# Patient Record
Sex: Female | Born: 1955 | Race: White | Hispanic: No | Smoking: Current every day smoker
Health system: Southern US, Community
[De-identification: ages and names within clinical notes are randomized; demographics above are authoritative.]

## PROBLEM LIST (undated history)

## (undated) DIAGNOSIS — E271 Primary adrenocortical insufficiency: Secondary | ICD-10-CM

## (undated) DIAGNOSIS — E079 Disorder of thyroid, unspecified: Secondary | ICD-10-CM

## (undated) DIAGNOSIS — E119 Type 2 diabetes mellitus without complications: Secondary | ICD-10-CM

## (undated) DIAGNOSIS — K5190 Ulcerative colitis, unspecified, without complications: Secondary | ICD-10-CM

## (undated) DIAGNOSIS — I10 Essential (primary) hypertension: Secondary | ICD-10-CM

## (undated) DIAGNOSIS — M549 Dorsalgia, unspecified: Secondary | ICD-10-CM

## (undated) DIAGNOSIS — E785 Hyperlipidemia, unspecified: Secondary | ICD-10-CM

## (undated) HISTORY — DX: Essential (primary) hypertension: I10

## (undated) HISTORY — DX: Primary adrenocortical insufficiency: E27.1

## (undated) HISTORY — DX: Ulcerative colitis, unspecified, without complications: K51.90

## (undated) HISTORY — DX: Hyperlipidemia, unspecified: E78.5

---

## 1979-06-19 HISTORY — PX: TOTAL ABDOMINAL HYSTERECTOMY: SHX209

## 2012-06-18 HISTORY — PX: STENT PLACEMENT ILIAC (ARMC HX): HXRAD1735

## 2013-06-18 HISTORY — PX: HEMORROIDECTOMY: SUR656

## 2014-06-18 DIAGNOSIS — E271 Primary adrenocortical insufficiency: Secondary | ICD-10-CM

## 2014-06-18 HISTORY — DX: Primary adrenocortical insufficiency: E27.1

## 2014-11-26 HISTORY — PX: BACK SURGERY: SHX140

## 2015-02-07 ENCOUNTER — Observation Stay (HOSPITAL_COMMUNITY)
Admission: EM | Admit: 2015-02-07 | Discharge: 2015-02-09 | Disposition: A | Discharge status: Home / Self Care | Payer: BLUE CROSS/BLUE SHIELD | Attending: Student in an Organized Health Care Education/Training Program | Admitting: Student in an Organized Health Care Education/Training Program

## 2015-02-07 ENCOUNTER — Observation Stay (HOSPITAL_COMMUNITY): Payer: BLUE CROSS/BLUE SHIELD

## 2015-02-07 ENCOUNTER — Encounter (HOSPITAL_COMMUNITY): Payer: Self-pay | Admitting: *Deleted

## 2015-02-07 DIAGNOSIS — E860 Dehydration: Secondary | ICD-10-CM | POA: Insufficient documentation

## 2015-02-07 DIAGNOSIS — E119 Type 2 diabetes mellitus without complications: Secondary | ICD-10-CM | POA: Diagnosis not present

## 2015-02-07 DIAGNOSIS — R55 Syncope and collapse: Secondary | ICD-10-CM | POA: Diagnosis not present

## 2015-02-07 DIAGNOSIS — R112 Nausea with vomiting, unspecified: Secondary | ICD-10-CM | POA: Insufficient documentation

## 2015-02-07 DIAGNOSIS — N179 Acute kidney failure, unspecified: Principal | ICD-10-CM | POA: Diagnosis present

## 2015-02-07 DIAGNOSIS — E871 Hypo-osmolality and hyponatremia: Secondary | ICD-10-CM | POA: Insufficient documentation

## 2015-02-07 DIAGNOSIS — I959 Hypotension, unspecified: Secondary | ICD-10-CM | POA: Diagnosis not present

## 2015-02-07 DIAGNOSIS — Z794 Long term (current) use of insulin: Secondary | ICD-10-CM | POA: Insufficient documentation

## 2015-02-07 DIAGNOSIS — F17210 Nicotine dependence, cigarettes, uncomplicated: Secondary | ICD-10-CM | POA: Diagnosis not present

## 2015-02-07 DIAGNOSIS — E861 Hypovolemia: Secondary | ICD-10-CM | POA: Insufficient documentation

## 2015-02-07 DIAGNOSIS — E2740 Unspecified adrenocortical insufficiency: Secondary | ICD-10-CM | POA: Diagnosis not present

## 2015-02-07 DIAGNOSIS — I10 Essential (primary) hypertension: Secondary | ICD-10-CM | POA: Diagnosis not present

## 2015-02-07 DIAGNOSIS — E1165 Type 2 diabetes mellitus with hyperglycemia: Secondary | ICD-10-CM

## 2015-02-07 DIAGNOSIS — E039 Hypothyroidism, unspecified: Secondary | ICD-10-CM | POA: Diagnosis present

## 2015-02-07 HISTORY — DX: Type 2 diabetes mellitus without complications: E11.9

## 2015-02-07 HISTORY — DX: Dorsalgia, unspecified: M54.9

## 2015-02-07 HISTORY — DX: Disorder of thyroid, unspecified: E07.9

## 2015-02-07 LAB — CBG MONITORING, ED: Glucose-Capillary: 155 mg/dL — ABNORMAL HIGH (ref 65–99)

## 2015-02-07 LAB — COMPREHENSIVE METABOLIC PANEL WITH GFR
ALT: 12 U/L — ABNORMAL LOW (ref 14–54)
AST: 17 U/L (ref 15–41)
Albumin: 4.2 g/dL (ref 3.5–5.0)
Alkaline Phosphatase: 82 U/L (ref 38–126)
Anion gap: 15 (ref 5–15)
BUN: 32 mg/dL — ABNORMAL HIGH (ref 6–20)
CO2: 22 mmol/L (ref 22–32)
Calcium: 10.2 mg/dL (ref 8.9–10.3)
Chloride: 97 mmol/L — ABNORMAL LOW (ref 101–111)
Creatinine, Ser: 3.64 mg/dL — ABNORMAL HIGH (ref 0.44–1.00)
GFR calc Af Amer: 15 mL/min — ABNORMAL LOW
GFR calc non Af Amer: 13 mL/min — ABNORMAL LOW
Glucose, Bld: 158 mg/dL — ABNORMAL HIGH (ref 65–99)
Potassium: 4.2 mmol/L (ref 3.5–5.1)
Sodium: 134 mmol/L — ABNORMAL LOW (ref 135–145)
Total Bilirubin: 0.8 mg/dL (ref 0.3–1.2)
Total Protein: 7.2 g/dL (ref 6.5–8.1)

## 2015-02-07 LAB — LIPASE, BLOOD: Lipase: 39 U/L (ref 22–51)

## 2015-02-07 LAB — CBC WITH DIFFERENTIAL/PLATELET
Basophils Absolute: 0.1 K/uL (ref 0.0–0.1)
Basophils Relative: 0 % (ref 0–1)
Eosinophils Absolute: 0.1 K/uL (ref 0.0–0.7)
Eosinophils Relative: 1 % (ref 0–5)
HCT: 39.8 % (ref 36.0–46.0)
Hemoglobin: 14.3 g/dL (ref 12.0–15.0)
Lymphocytes Relative: 23 % (ref 12–46)
Lymphs Abs: 2.9 K/uL (ref 0.7–4.0)
MCH: 35.4 pg — ABNORMAL HIGH (ref 26.0–34.0)
MCHC: 35.9 g/dL (ref 30.0–36.0)
MCV: 98.5 fL (ref 78.0–100.0)
Monocytes Absolute: 0.6 K/uL (ref 0.1–1.0)
Monocytes Relative: 5 % (ref 3–12)
Neutro Abs: 9.1 K/uL — ABNORMAL HIGH (ref 1.7–7.7)
Neutrophils Relative %: 71 % (ref 43–77)
Platelets: 244 K/uL (ref 150–400)
RBC: 4.04 MIL/uL (ref 3.87–5.11)
RDW: 13.9 % (ref 11.5–15.5)
WBC: 12.8 K/uL — ABNORMAL HIGH (ref 4.0–10.5)

## 2015-02-07 LAB — URINE MICROSCOPIC-ADD ON

## 2015-02-07 LAB — URINALYSIS, ROUTINE W REFLEX MICROSCOPIC
Bilirubin Urine: NEGATIVE
Glucose, UA: NEGATIVE mg/dL
Hgb urine dipstick: NEGATIVE
Ketones, ur: NEGATIVE mg/dL
Leukocytes, UA: NEGATIVE
Nitrite: NEGATIVE
Protein, ur: 30 mg/dL — AB
Specific Gravity, Urine: 1.007 (ref 1.005–1.030)
Urobilinogen, UA: 0.2 mg/dL (ref 0.0–1.0)
pH: 6.5 (ref 5.0–8.0)

## 2015-02-07 LAB — GLUCOSE, CAPILLARY: GLUCOSE-CAPILLARY: 224 mg/dL — AB (ref 65–99)

## 2015-02-07 LAB — MRSA PCR SCREENING: MRSA BY PCR: POSITIVE — AB

## 2015-02-07 LAB — I-STAT CG4 LACTIC ACID, ED: LACTIC ACID, VENOUS: 2.66 mmol/L — AB (ref 0.5–2.0)

## 2015-02-07 LAB — CREATININE, URINE, RANDOM: CREATININE, URINE: 58.61 mg/dL

## 2015-02-07 LAB — SODIUM, URINE, RANDOM: Sodium, Ur: 64 mmol/L

## 2015-02-07 MED ORDER — MORPHINE SULFATE (PF) 2 MG/ML IV SOLN: 2.0000 mg | Freq: Once | INTRAVENOUS | Status: DC

## 2015-02-07 MED ORDER — SODIUM CHLORIDE 0.9 % IV SOLN
Freq: Once | INTRAVENOUS | Status: AC
  Administered 2015-02-07: 13:00:00 via INTRAVENOUS

## 2015-02-07 MED ORDER — SODIUM CHLORIDE 0.9 % IV BOLUS (SEPSIS)
1000.0000 mL | Freq: Once | INTRAVENOUS | Status: AC
  Administered 2015-02-07: 1000 mL via INTRAVENOUS

## 2015-02-07 MED ORDER — SODIUM CHLORIDE 0.9 % IV SOLN: 1000.0000 mL | Freq: Once | INTRAVENOUS | Status: DC

## 2015-02-07 MED ORDER — INSULIN ASPART 100 UNIT/ML ~~LOC~~ SOLN
0.0000 [IU] | Freq: Every day | SUBCUTANEOUS | Status: DC
  Administered 2015-02-07: 2 [IU] via SUBCUTANEOUS

## 2015-02-07 MED ORDER — SODIUM CHLORIDE 0.9 % IV SOLN: 250.0000 mL | INTRAVENOUS | Status: DC | PRN

## 2015-02-07 MED ORDER — INSULIN ASPART 100 UNIT/ML ~~LOC~~ SOLN
0.0000 [IU] | Freq: Three times a day (TID) | SUBCUTANEOUS | Status: DC
  Administered 2015-02-08: 5 [IU] via SUBCUTANEOUS
  Administered 2015-02-08 – 2015-02-09 (×2): 1 [IU] via SUBCUTANEOUS

## 2015-02-07 MED ORDER — SODIUM CHLORIDE 0.9 % IJ SOLN: 3.0000 mL | INTRAMUSCULAR | Status: DC | PRN

## 2015-02-07 MED ORDER — ACETAMINOPHEN 325 MG PO TABS: 650.0000 mg | ORAL_TABLET | Freq: Four times a day (QID) | ORAL | Status: DC | PRN

## 2015-02-07 MED ORDER — ONDANSETRON HCL 4 MG/2ML IJ SOLN: 4.0000 mg | Freq: Three times a day (TID) | INTRAMUSCULAR | Status: DC | PRN

## 2015-02-07 MED ORDER — SODIUM CHLORIDE 0.9 % IV BOLUS (SEPSIS)
2000.0000 mL | Freq: Once | INTRAVENOUS | Status: AC
  Administered 2015-02-07: 2000 mL via INTRAVENOUS

## 2015-02-07 MED ORDER — ONDANSETRON HCL 4 MG/2ML IJ SOLN: 4.0000 mg | Freq: Four times a day (QID) | INTRAMUSCULAR | Status: DC | PRN

## 2015-02-07 MED ORDER — ENOXAPARIN SODIUM 40 MG/0.4ML ~~LOC~~ SOLN
40.0000 mg | SUBCUTANEOUS | Status: DC
  Administered 2015-02-07 – 2015-02-08 (×2): 40 mg via SUBCUTANEOUS
  Filled 2015-02-07 (×2): qty 0.4

## 2015-02-07 MED ORDER — MORPHINE SULFATE (PF) 2 MG/ML IV SOLN
2.0000 mg | INTRAVENOUS | Status: DC | PRN
  Administered 2015-02-07 – 2015-02-08 (×2): 2 mg via INTRAVENOUS
  Filled 2015-02-07 (×2): qty 1

## 2015-02-07 MED ORDER — SODIUM CHLORIDE 0.9 % IV SOLN
INTRAVENOUS | Status: DC
  Administered 2015-02-07 (×3): via INTRAVENOUS

## 2015-02-07 MED ORDER — LEVOTHYROXINE SODIUM 25 MCG PO TABS
175.0000 ug | ORAL_TABLET | Freq: Every day | ORAL | Status: DC
  Administered 2015-02-08 – 2015-02-09 (×2): 175 ug via ORAL
  Filled 2015-02-07 (×4): qty 1

## 2015-02-07 MED ORDER — ONDANSETRON HCL 4 MG/2ML IJ SOLN
4.0000 mg | Freq: Once | INTRAMUSCULAR | Status: AC
  Administered 2015-02-07: 4 mg via INTRAVENOUS
  Filled 2015-02-07: qty 2

## 2015-02-07 MED ORDER — SODIUM CHLORIDE 0.9 % IJ SOLN: 3.0000 mL | Freq: Two times a day (BID) | INTRAMUSCULAR | Status: DC

## 2015-02-07 MED ORDER — MORPHINE SULFATE (PF) 2 MG/ML IV SOLN: 2.0000 mg | INTRAVENOUS | Status: DC | PRN

## 2015-02-07 MED ORDER — ONDANSETRON HCL 4 MG PO TABS: 4.0000 mg | ORAL_TABLET | Freq: Four times a day (QID) | ORAL | Status: DC | PRN

## 2015-02-07 MED ORDER — ACETAMINOPHEN 650 MG RE SUPP: 650.0000 mg | Freq: Four times a day (QID) | RECTAL | Status: DC | PRN

## 2015-02-07 NOTE — ED Notes (Signed)
Incision from back surgery on June 10th inspected.   Healed well, no signs of infection.

## 2015-02-07 NOTE — ED Notes (Signed)
Patient transported to Ultrasound 

## 2015-02-07 NOTE — ED Provider Notes (Signed)
S: Courtney Bonilla is a 59 y.o. female presents to the ED with persistent nausea and vomiting x 2 weeks after treatment of a right breast cellulitis with Bactrim.  Presents today with generalized weakness, persistent nausea and vomiting, tachypnea and hypotension.  She reports abdominal soreness from emesis  O:  General: Awake  HEENT: Atraumatic, dry mucous membranes  Resp: Normal effort, clear and equal breath sounds  Abd: Nondistended, mild soreness no rebound or peritoneal signs, nonfocal  MSK: spinal surgical incision well healing without erythema, induration or purulent drainage Neuro:No focal weakness  Lymph: No adenopathy   A/P:  Patient with acute renal failure likely from Bactrim usage. Creatinine 3.64 and BUN 32 with no history of kidney disease. Mild leukocytosis at 12.8 and lactic acid 2.66. Vital signs have stabilized after fluid bolus. Will admit to teaching service. Discussed with Dr. Mikey Bussing.  BP 98/47 mmHg  Pulse 76  Temp(Src) 97.4 F (36.3 C) (Rectal)  Resp 24  SpO2 100%  Pt was seen by Gaylyn Rong, PA-C and personally evaluated and supervised by Dierdre Forth, PA-C and Rolland Porter, MD    [facility], PA-C 02/07/15 1429  Rolland Porter, MD 02/07/15 [phone]

## 2015-02-07 NOTE — ED Provider Notes (Signed)
CSN: [id number]     Arrival date & time 02/07/15  1059 History   First MD Initiated Contact with Patient 02/07/15 1156     Chief Complaint  Patient presents with  . Abdominal Pain  . Loss of Consciousness  . Weakness  . Emesis     (Consider location/radiation/quality/duration/timing/severity/associated sxs/prior Treatment) HPI Comments: Courtney Bonilla is a 59 y/o F with a pmhx of hypothyroidism, DM, and sciatica s/p back surgery on 11/26/14 who presents today c/o nausea, vomiting and gerneralized weakness. Pt states that since her back surgery in June she has not been feeling very well, "just weaker than usual". Pt states that in the last 2 weeks she has been extremely nauseous, vomiting multiple times a day and very week. Pt was seen at ED in [place] for same symptoms, given fluids and was d/c home on Bactrim for cellulitis on Right breast. Pt states Bactrim worsened her nausea so she stopped taking it. Today pt states that in the last week the nausea has worsened and now she is getting very dizzy and lightheaded and "falling into things". Pt states that she does not think she has actually passed out but she gets weak and stumbles while walking. Pt feels more dizzy when standing than sitting. No falls, or injury. Pt unable to tolerate PO fluids and medications, decreased appetite. No noticeable weight loss. Denies fever, chills, change in bowel or bladder habits, cough, SOB, CP. Pt states that abdomen hurts from vomiting. Pt is hypotensive in triage with a BP of 69/45, HR 130, and RR 32. Denies alcohol intake. Smokes 1 ppd. No recreational drugs.   Patient is a 59 y.o. female presenting with abdominal pain, syncope, weakness, and vomiting. The history is provided by the patient and the spouse.  Abdominal Pain Associated symptoms: fatigue, nausea and vomiting   Associated symptoms: no chest pain, no chills, no cough, no diarrhea, no dysuria, no fever, no hematuria, no shortness of breath, no vaginal  bleeding and no vaginal discharge   Loss of Consciousness Associated symptoms: dizziness, nausea, vomiting and weakness   Associated symptoms: no chest pain, no diaphoresis, no fever, no palpitations and no shortness of breath   Weakness Associated symptoms include abdominal pain, fatigue, nausea, vomiting and weakness. Pertinent negatives include no chest pain, chills, congestion, coughing, diaphoresis or fever.  Emesis Associated symptoms: abdominal pain   Associated symptoms: no chills and no diarrhea     Past Medical History  Diagnosis Date  . Diabetes mellitus without complication   . Back pain    Past Surgical History  Procedure Laterality Date  . Back surgery     No family history on file. Social History  Substance Use Topics  . Smoking status: Current Every Day Smoker  . Smokeless tobacco: None  . Alcohol Use: No   OB History    No data available     Review of Systems  Constitutional: Positive for appetite change and fatigue. Negative for fever, chills, diaphoresis and unexpected weight change.  HENT: Negative for congestion.   Eyes: Negative for visual disturbance.  Respiratory: Negative for cough, chest tightness and shortness of breath.   Cardiovascular: Positive for syncope. Negative for chest pain, palpitations and leg swelling.  Gastrointestinal: Positive for nausea, vomiting and abdominal pain. Negative for diarrhea.  Genitourinary: Negative for dysuria, hematuria, vaginal bleeding, vaginal discharge and difficulty urinating.  Musculoskeletal: Positive for back pain.  Skin: Positive for wound ( cellulitis of right breast).  Neurological: Positive for dizziness, weakness and  light-headedness. Negative for syncope.  All other systems reviewed and are negative.     Allergies  Review of patient's allergies indicates no known allergies.  Home Medications   Prior to Admission medications   Medication Sig Start Date End Date Taking? Authorizing Provider   baclofen (LIORESAL) 10 MG tablet Take 10 mg by mouth 3 (three) times daily.   Yes Historical Provider, MD  levothyroxine (SYNTHROID, LEVOTHROID) 175 MCG tablet Take 175 mcg by mouth daily before breakfast.   Yes Historical Provider, MD  sulfamethoxazole-trimethoprim (BACTRIM DS,SEPTRA DS) 800-160 MG per tablet Take 1 tablet by mouth 2 (two) times daily.   Yes Historical Provider, MD   BP 69/45 mmHg  Pulse 130  Temp(Src) 97.6 F (36.4 C) (Oral)  Resp 32  SpO2 100% Physical Exam  Constitutional: She is oriented to person, place, and time. She appears well-developed and well-nourished. No distress.  Pt is very sleepy on exam.   HENT:  Head: Normocephalic and atraumatic.  Mouth/Throat: No oropharyngeal exudate.  Eyes: Conjunctivae are normal. Pupils are equal, round, and reactive to light. No scleral icterus.  Neck: Thyromegaly present.  Cardiovascular: Regular rhythm, normal heart sounds and intact distal pulses.  Exam reveals no gallop and no friction rub.   No murmur heard. Tachycardic HR 130bpm   Pulmonary/Chest: Effort normal and breath sounds normal. No respiratory distress. She has no wheezes. She has no rales. She exhibits no tenderness.  Abdominal: Soft. Bowel sounds are normal. She exhibits no distension and no mass. There is tenderness ( diffuse tenderness. No focal areas of tenderness). There is no rebound and no guarding.  Musculoskeletal: Normal range of motion. She exhibits no edema or tenderness.  Lymphadenopathy:    She has no cervical adenopathy.  Neurological: She is alert and oriented to person, place, and time. No cranial nerve deficit.  Skin: Skin is warm and dry. She is not diaphoretic.  2cm area of cellulitis to Right breast under areola. Area is erythematous. NOT warm to the touch. Appears clean and dry. No pus or blood. Area is scabbed over. In healing process.   Vitals reviewed.   ED Course  Procedures (including critical care time) Pt seen Peripheral IV  inserted Labs drawn EKG obtained Pt husband presented printed reports from visit to [facility] ED Labs ordered Rectal temp requested Spoke with Dr. Fayrene Fearing who agrees with plan of care Pt will be admitted to teaching service  Labs Review Labs Reviewed  CBC WITH DIFFERENTIAL/PLATELET - Abnormal; Notable for the following:    WBC 12.8 (*)    MCH 35.4 (*)    Neutro Abs 9.1 (*)    All other components within normal limits  COMPREHENSIVE METABOLIC PANEL - Abnormal; Notable for the following:    Sodium 134 (*)    Chloride 97 (*)    Glucose, Bld 158 (*)    BUN 32 (*)    Creatinine, Ser 3.64 (*)    ALT 12 (*)    GFR calc non Af Amer 13 (*)    GFR calc Af Amer 15 (*)    All other components within normal limits  URINALYSIS, ROUTINE W REFLEX MICROSCOPIC (NOT AT [facility]) - Abnormal; Notable for the following:    APPearance CLOUDY (*)    Protein, ur 30 (*)    All other components within normal limits  URINE MICROSCOPIC-ADD ON - Abnormal; Notable for the following:    Squamous Epithelial / LPF MANY (*)    Bacteria, UA FEW (*)  Casts GRANULAR CAST (*)    Crystals CA OXALATE CRYSTALS (*)    All other components within normal limits  CBG MONITORING, ED - Abnormal; Notable for the following:    Glucose-Capillary 155 (*)    All other components within normal limits  I-STAT CG4 LACTIC ACID, ED - Abnormal; Notable for the following:    Lactic Acid, Venous 2.66 (*)    All other components within normal limits  LIPASE, BLOOD  SODIUM, URINE, RANDOM  CREATININE, URINE, RANDOM  CYTOLOGY - NON PAP    Imaging Review No results found. I have personally reviewed and evaluated these images and lab results as part of my medical decision-making.   EKG Interpretation None      MDM   Final diagnoses:  AKI (acute kidney injury)   Pt seen for N/V, generalized weakness and near syncope.  Pt recently had back surgery 11/26/14. Pt placed on bactrim 2 weeks ago for R breast cellulitis. Pt states severe n/v  since initiating abx. Pt now in ARF, likely due to bactrim intake. Cr 3.6, BUN 32. Mild leukocytosis of 12.8. Pt vital signs have stabilized since administration of 2L fluid bolus. Pt now feeling much better, abdominal pain and nausea have subsided. Spoke with Dr. Mikey Bussing who agrees that the pt needs to be admitted. Teaching service will be accepting pt to their service for inpatient treatment and observation. UA negative for infection.    Lester Kinsman [place], PA-C 02/07/15 1502  Rolland Porter, MD 02/11/15 [phone]

## 2015-02-07 NOTE — ED Notes (Signed)
Blood cultures drawn and placed at bedside.

## 2015-02-07 NOTE — ED Notes (Signed)
Paged admitting provider to discuss pt's BP and level of care.

## 2015-02-07 NOTE — H&P (Signed)
Date: 02/07/2015               Patient Name:  Courtney Bonilla MRN: [id number]  DOB: 04-11-1956 Age / Sex: 59 y.o., female   PCP: No primary care provider on file.              Medical Service: Internal Medicine Teaching Service              Attending Physician: Dr. Tyson Alias, MD    First Contact: Eyvonne Mechanic, MS 3 Pager: [phone]  Second Contact: Dr. Darreld Mclean Pager: [phone]  Third Contact Dr. Carlynn Purl Pager: [phone]       After Hours (After 5p/  First Contact Pager: [phone]  weekends / holidays): Second Contact Pager: [phone]   Chief Complaint: Nausea and vomiting  History of Present Illness: Courtney Bonilla is a 59 y.o. female that  has a past medical history of Diabetes mellitus without complication; Back pain; and Thyroid disease. who presents with fours days of nausea and vomiting. She notes that she has been feeling fatigued and weak for the past few months, ever since she got surgery on her back for sciatica, but didn't seek medical attention until this past Wednesday (8/17) where she went to the [facility] ED where she was diagnosed with cellulitis of the R breast and sent home with trimethoprim-sulfamethoxazole. On Friday (8/19) she started feeling nauseated and vomiting which continued until presentation. She stopped taking the TMP-SMX on Saturday due to the nausea and vomiting. She has been unable to eat or drink anything since the nausea stated except for occasional soft drinks. She reports that she has felt very cold lately. She denies feeling feverish, headaches, difficulty breathing, or cough. She also denies pain with urination, urgency, increased frequency, and bowel or bladder incontinence. She does report general weakness and numbness in her feet.   Meds: Current Facility-Administered Medications  Medication Dose Route Frequency Provider Last Rate Last Dose  . morphine 2 MG/ML injection 2 mg  2 mg Intravenous Once Dierdre Forth, PA-C   Stopped at 02/07/15 1506   Current Outpatient Prescriptions  Medication Sig Dispense Refill  . baclofen (LIORESAL) 10 MG tablet Take 10 mg by mouth 3 (three) times daily.    Marland Kitchen levothyroxine (SYNTHROID, LEVOTHROID) 175 MCG tablet Take 175 mcg by mouth daily before breakfast.    . sulfamethoxazole-trimethoprim (BACTRIM DS,SEPTRA DS) 800-160 MG per tablet Take 1 tablet by mouth 2 (two) times daily.      Allergies: Allergies as of 02/07/2015  . (No Known Allergies)   Past Medical History  Diagnosis Date  . Diabetes mellitus without complication   . Back pain   . Thyroid disease    Past Surgical History  Procedure Laterality Date  . Back surgery  November 26 2014    Dr Marikay Alar   No family history on file. Social History   Social History  . Marital Status: Widowed    Spouse Name: N/A  . Number of Children: N/A  . Years of Education: N/A   Occupational History  . Not on file.   Social History Main Topics  . Smoking status: Current Every Day Smoker  . Smokeless tobacco: Not on file  . Alcohol Use: No  . Drug Use: Not on file  . Sexual Activity: Not on file   Other Topics Concern  . Not on file   Social History Narrative    Review of Systems: Review of Systems - History obtained  from the patient General ROS: positive for  - malaise negative for - chills or fever Respiratory ROS: no cough, shortness of breath, or wheezing Cardiovascular ROS: no chest pain or dyspnea on exertion negative for - palpitations Gastrointestinal ROS: no abdominal pain, change in bowel habits, or black or bloody stools positive for - nausea/vomiting negative for - constipation, diarrhea, hematemesis or melena Musculoskeletal ROS: positive for - muscular weakness negative for - gait disturbance Neurological ROS: positive for - numbness/tingling and weakness negative for - bowel and bladder control changes, headaches or memory loss  Physical Exam: Blood pressure  88/41, pulse 58, temperature 97.4 F (36.3 C), temperature source Rectal, resp. rate 21, SpO2 93 %. BP 88/41 mmHg  Pulse 58  Temp(Src) 97.4 F (36.3 C) (Rectal)  Resp 21  SpO2 93%  General Appearance:    Alert, cooperative, no distress, appears stated age  Head:    Normocephalic, without obvious abnormality, atraumatic  Neck:   Supple, symmetrical, trachea midline,  Back:     Symmetric, no curvature, ROM normal, CVA tenderness bilaterally  Lungs:     Clear to auscultation bilaterally, respirations unlabored  Chest Wall:    No tenderness or deformity   Heart:    Regular rate and rhythm, S1 and S2 normal, no murmur, rub   or gallop  Breast Exam:    1x1 cm area of cellulitis just inferior to R areola  Abdomen:     Soft, non-tender, bowel sounds active all four quadrants,    no masses, no organomegaly  Extremities:   Extremities normal, atraumatic, no cyanosis or edema  Pulses:   2+ and symmetric all extremities  Skin:   Skin texture, turgor normal, no rashes or lesions except as noted above, deeply tanned complexion without hyperpigmentation of palmar creases    Lab results: Results for orders placed or performed during the hospital encounter of 02/07/15 (from the past 24 hour(s))  CBG monitoring, ED     Status: Abnormal   Collection Time: 02/07/15 11:58 AM  Result Value Ref Range   Glucose-Capillary 155 (H) 65 - 99 mg/dL  CBC with Differential     Status: Abnormal   Collection Time: 02/07/15 12:23 PM  Result Value Ref Range   WBC 12.8 (H) 4.0 - 10.5 K/uL   RBC 4.04 3.87 - 5.11 MIL/uL   Hemoglobin 14.3 12.0 - 15.0 g/dL   HCT 16.1 09.6 - 04.5 %   MCV 98.5 78.0 - 100.0 fL   MCH 35.4 (H) 26.0 - 34.0 pg   MCHC 35.9 30.0 - 36.0 g/dL   RDW 40.9 81.1 - 91.4 %   Platelets 244 150 - 400 K/uL   Neutrophils Relative % 71 43 - 77 %   Neutro Abs 9.1 (H) 1.7 - 7.7 K/uL   Lymphocytes Relative 23 12 - 46 %   Lymphs Abs 2.9 0.7 - 4.0 K/uL   Monocytes Relative 5 3 - 12 %   Monocytes  Absolute 0.6 0.1 - 1.0 K/uL   Eosinophils Relative 1 0 - 5 %   Eosinophils Absolute 0.1 0.0 - 0.7 K/uL   Basophils Relative 0 0 - 1 %   Basophils Absolute 0.1 0.0 - 0.1 K/uL  Comprehensive metabolic panel     Status: Abnormal   Collection Time: 02/07/15 12:23 PM  Result Value Ref Range   Sodium 134 (L) 135 - 145 mmol/L   Potassium 4.2 3.5 - 5.1 mmol/L   Chloride 97 (L) 101 - 111 mmol/L   CO2  22 22 - 32 mmol/L   Glucose, Bld 158 (H) 65 - 99 mg/dL   BUN 32 (H) 6 - 20 mg/dL   Creatinine, Ser 1.61 (H) 0.44 - 1.00 mg/dL   Calcium 09.6 8.9 - 04.5 mg/dL   Total Protein 7.2 6.5 - 8.1 g/dL   Albumin 4.2 3.5 - 5.0 g/dL   AST 17 15 - 41 U/L   ALT 12 (L) 14 - 54 U/L   Alkaline Phosphatase 82 38 - 126 U/L   Total Bilirubin 0.8 0.3 - 1.2 mg/dL   GFR calc non Af Amer 13 (L) >60 mL/min   GFR calc Af Amer 15 (L) >60 mL/min   Anion gap 15 5 - 15  Lipase, blood     Status: None   Collection Time: 02/07/15 12:23 PM  Result Value Ref Range   Lipase 39 22 - 51 U/L  I-Stat CG4 Lactic Acid, ED     Status: Abnormal   Collection Time: 02/07/15 12:56 PM  Result Value Ref Range   Lactic Acid, Venous 2.66 (HH) 0.5 - 2.0 mmol/L   Comment NOTIFIED PHYSICIAN   Urinalysis, Routine w reflex microscopic (not at [facility])     Status: Abnormal   Collection Time: 02/07/15  1:14 PM  Result Value Ref Range   Color, Urine YELLOW YELLOW   APPearance CLOUDY (A) CLEAR   Specific Gravity, Urine 1.007 1.005 - 1.030   pH 6.5 5.0 - 8.0   Glucose, UA NEGATIVE NEGATIVE mg/dL   Hgb urine dipstick NEGATIVE NEGATIVE   Bilirubin Urine NEGATIVE NEGATIVE   Ketones, ur NEGATIVE NEGATIVE mg/dL   Protein, ur 30 (A) NEGATIVE mg/dL   Urobilinogen, UA 0.2 0.0 - 1.0 mg/dL   Nitrite NEGATIVE NEGATIVE   Leukocytes, UA NEGATIVE NEGATIVE  Urine microscopic-add on     Status: Abnormal   Collection Time: 02/07/15  1:14 PM  Result Value Ref Range   Squamous Epithelial / LPF MANY (A) RARE   WBC, UA 0-2 <3 WBC/hpf   Bacteria, UA FEW (A)  RARE   Casts GRANULAR CAST (A) NEGATIVE   Crystals CA OXALATE CRYSTALS (A) NEGATIVE     Imaging results:  US Renal  02/07/2015   CLINICAL DATA:  Acute renal failure.  Diabetes.  EXAM: RENAL / URINARY TRACT ULTRASOUND COMPLETE  COMPARISON:  None.  FINDINGS: Right Kidney:  Length: 11.1 cm. Echogenicity within normal limits. No mass or hydronephrosis visualized.  Left Kidney:  Length: 10.7 cm. Echogenicity within normal limits. No mass or hydronephrosis visualized. 1.2 cm cyst.  Bladder:  Appears normal for degree of bladder distention. Bilateral ureteral jets visualized.  IMPRESSION: No evidence of hydronephrosis.  1.2 cm left renal cyst.   Electronically Signed   By: Elberta Fortis M.D.   On: 02/07/2015 15:21    Other results: EKG: normal EKG, normal sinus rhythm, sinus tachycardia.  Assessment & Plan by Problem: Principal Problem:   ARF (acute renal failure) Active Problems:   Type 2 diabetes mellitus   Hypothyroidism  ARF (acute renal failure): Records from outside hospital show SCr of 1.35 five days ago. Leading the diferential would be ATN from dehydration given her 4 day history of N/V and decreased intake. AIN due to Bactrim administration is also a possibility given the proximity of the recent prescription and onset of N/V a few days into the course. - 0.9% NS fluid bolus resuscitation 1L IV, give up to 5L total - If still hypotensive after 5L IV NS, give 10mg  dexamethasone IV  General Malaise: Given the patient's onset right after surgery and recent hypotension, adrenal insufficiency is a potential explanation for the patient's constellation of symptoms.  - AM cortisol tomorrow - Onansetron  IV PRN for nausea - BMP tomorrow AM  Hypothyroidism: - COntinue home synthroid   Diabetes Mellitus: - Hold home amaril - SSI  FEN/GI: - Diet: Carb modified - Ondansetron PRN for nausea  Prophylaxis: -  Lovenox  SQ daily  Dispo: Disposition is deferred at this time  until patient condition improves. Anticipate discharge in 1-3 days.  This is a Psychologist, occupational Note.  The care of the patient was discussed with Dr. Darreld Mclean and the assessment and plan was formulated with their assistance.  Please see their note for official documentation of the patient encounter.   Signed: Eyvonne Mechanic, Med Student 02/07/2015, 4:11 PM

## 2015-02-07 NOTE — ED Notes (Signed)
Attempted report 

## 2015-02-07 NOTE — H&P (Signed)
Date: 02/07/2015               Patient Name:  Courtney Bonilla MRN: [id number]  DOB: [birth year] Age / Sex: 59 y.o., female   PCP: No primary care provider on file.         Medical Service: Internal Medicine Teaching Service         Attending Physician: Dr. Tyson Alias, MD    First Contact: Eyvonne Mechanic MS3 Pager: [phone]  Second Contact: Third Contact: Dr. Darreld Mclean Dr. Carlynn Purl Pager: Pager: [phone]         After Hours (After 5p/  First Contact Pager: [phone]  weekends / holidays): Second Contact Pager: [phone]   Chief Complaint: Nausea/Vomiting  History of Present Illness: Courtney Bonilla is a 59 year old lady with PMH of hypothyroidism, DM, HTN, and sciatica s/p back surgery on 11/26/14 who presents to the ED with complaints of nausea, vomiting, and generalized weakness. She states that she has been feeling generally unwell and weak since her surgery. She went to the ED of [facility] in [place] near where she lives for similar symptoms. She was given IV fluids and discharged home on a course of Bactrim for a small area of cellulitis on her right breast. She states that she started taking Bactrim last Wednesday 8/17 and on Friday 8/19 she began to have nausea and vomiting. Her last dose of Bactrim was on Saturday 8/20 which she discontinued as she attributed it to her symptoms. She has not been able to hold any food or liquids down. She tried Pepto-Bismal which she also could not keep down. She does endorse dizziness and lightheadedness but denies falling or syncope. Her appetite is decreased and she denies any recent changes in weight. She denies dysuria, diarrhea, fever, chills, cough, SOB, or chest pain. She smokes 1 PPD.  Vitals on arrival: BP 69/45, Pulse 130, RR 32, Temp 97.6, SpO2 100% room air.  Meds: Current Facility-Administered Medications  Medication Dose Route Frequency Provider Last Rate Last Dose  . morphine 2 MG/ML  injection 2 mg  2 mg Intravenous Once Dierdre Forth, PA-C   Stopped at 02/07/15 1506   Current Outpatient Prescriptions  Medication Sig Dispense Refill  . baclofen (LIORESAL) 10 MG tablet Take 10 mg by mouth 3 (three) times daily.    Marland Kitchen levothyroxine (SYNTHROID, LEVOTHROID) 175 MCG tablet Take 175 mcg by mouth daily before breakfast.    . sulfamethoxazole-trimethoprim (BACTRIM DS,SEPTRA DS) 800-160 MG per tablet Take 1 tablet by mouth 2 (two) times daily.      Allergies: Allergies as of 02/07/2015  . (No Known Allergies)   Past Medical History  Diagnosis Date  . Diabetes mellitus without complication   . Back pain   . Thyroid disease    Past Surgical History  Procedure Laterality Date  . Back surgery  November 26 2014    Dr Marikay Alar   No family history on file. Social History   Social History  . Marital Status: Widowed    Spouse Name: N/A  . Number of Children: N/A  . Years of Education: N/A   Occupational History  . Not on file.   Social History Main Topics  . Smoking status: Current Every Day Smoker  . Smokeless tobacco: Not on file  . Alcohol Use: No  . Drug Use: Not on file  . Sexual Activity: Not on file   Other Topics Concern  . Not on file  Social History Narrative    Review of Systems: Review of Systems  Constitutional: Positive for chills and malaise/fatigue. Negative for diaphoresis.  Eyes: Negative for blurred vision and pain.  Respiratory: Negative for cough and shortness of breath.   Cardiovascular: Negative for chest pain and leg swelling.  Gastrointestinal: Positive for nausea and vomiting. Negative for abdominal pain, diarrhea, constipation, blood in stool and melena.  Genitourinary: Negative for dysuria, urgency, frequency and flank pain.  Musculoskeletal: Positive for back pain. Negative for myalgias and falls.  Skin: Negative for itching and rash.  Neurological: Positive for dizziness and weakness. Negative for headaches.      Physical Exam: Blood pressure 90/47, pulse 71, temperature 97.4 F (36.3 C), temperature source Rectal, resp. rate 21, SpO2 99 %. Physical Exam  Constitutional: She is oriented to person, place, and time. She appears well-developed and well-nourished.  HENT:  Head: Normocephalic and atraumatic.  Eyes: EOM are normal.  Cardiovascular: Normal rate, regular rhythm and intact distal pulses.   Pulmonary/Chest: Effort normal and breath sounds normal.  Abdominal: Soft. Bowel sounds are normal. She exhibits no distension. There is no tenderness. There is CVA tenderness.  Neurological: She is alert and oriented to person, place, and time.  Skin: Skin is warm.  1x1 cm area of healing cellulitis inferior to areola. Tanned complexion.  Psychiatric: She has a normal mood and affect.     Lab results: Basic Metabolic Panel:  Recent Labs  16/10/96 1223  NA 134*  K 4.2  CL 97*  CO2 22  GLUCOSE 158*  BUN 32*  CREATININE 3.64*  CALCIUM 10.2   Liver Function Tests:  Recent Labs  02/07/15 1223  AST 17  ALT 12*  ALKPHOS 82  BILITOT 0.8  PROT 7.2  ALBUMIN 4.2    Recent Labs  02/07/15 1223  LIPASE 39   No results for input(s): AMMONIA in the last 72 hours. CBC:  Recent Labs  02/07/15 1223  WBC 12.8*  NEUTROABS 9.1*  HGB 14.3  HCT 39.8  MCV 98.5  PLT 244   Cardiac Enzymes: No results for input(s): CKTOTAL, CKMB, CKMBINDEX, TROPONINI in the last 72 hours. BNP: No results for input(s): PROBNP in the last 72 hours. D-Dimer: No results for input(s): DDIMER in the last 72 hours. CBG:  Recent Labs  02/07/15 1158  GLUCAP 155*   Hemoglobin A1C: No results for input(s): HGBA1C in the last 72 hours. Fasting Lipid Panel: No results for input(s): CHOL, HDL, LDLCALC, TRIG, CHOLHDL, LDLDIRECT in the last 72 hours. Thyroid Function Tests: No results for input(s): TSH, T4TOTAL, FREET4, T3FREE, THYROIDAB in the last 72 hours. Anemia Panel: No results for input(s):  VITAMINB12, FOLATE, FERRITIN, TIBC, IRON, RETICCTPCT in the last 72 hours. Coagulation: No results for input(s): LABPROT, INR in the last 72 hours. Urine Drug Screen: Drugs of Abuse  No results found for: LABOPIA, COCAINSCRNUR, LABBENZ, AMPHETMU, THCU, LABBARB  Alcohol Level: No results for input(s): ETH in the last 72 hours. Urinalysis:  Recent Labs  02/07/15 1314  COLORURINE YELLOW  LABSPEC 1.007  PHURINE 6.5  GLUCOSEU NEGATIVE  HGBUR NEGATIVE  BILIRUBINUR NEGATIVE  KETONESUR NEGATIVE  PROTEINUR 30*  UROBILINOGEN 0.2  NITRITE NEGATIVE  LEUKOCYTESUR NEGATIVE    Imaging results:  US Renal  02/07/2015   CLINICAL DATA:  Acute renal failure.  Diabetes.  EXAM: RENAL / URINARY TRACT ULTRASOUND COMPLETE  COMPARISON:  None.  FINDINGS: Right Kidney:  Length: 11.1 cm. Echogenicity within normal limits. No mass or hydronephrosis visualized.  Left Kidney:  Length: 10.7 cm. Echogenicity within normal limits. No mass or hydronephrosis visualized. 1.2 cm cyst.  Bladder:  Appears normal for degree of bladder distention. Bilateral ureteral jets visualized.  IMPRESSION: No evidence of hydronephrosis.  1.2 cm left renal cyst.   Electronically Signed   By: Elberta Fortis M.D.   On: 02/07/2015 15:21    Other results: EKG: there are no previous tracings available for comparison, sinus tachycardia.  Assessment & Plan by Problem: Principal Problem:   ARF (acute renal failure) Active Problems:   Type 2 diabetes mellitus   Hypothyroidism   Acute renal failure  Acute Renal Failure: Patient had a BUN 22 and Cr 1.35 on 8/17 when she visited the ED of [facility]. She has an increase in BUN and creatinine on this visit to 32 and 3.64 that is in the setting of 4 days use of Bactrim with associated nausea and vomiting. Her BP on arrival was 69/45 with pulse 130. Differentials for cause include Bactrim induced nephropathy and Hypovolemia secondary to dehydration/emesis. Another  consideration is adrenal insufficiency as patient has history of polyglandular disease with hypothyroidism and diabetes as well as a tanned complexion and recent surgery in addition to her hypotension and symptoms of nausea/vomiting. -Renal US without hydronephrosis -IV NS rehydration -Strict intake and output -Urine creatinine -Urine Sodium/FENa -AM Cortisol -Zofran 4 mg prn for nausea -Repeat BMP -If continued hypotension overnight in spite of fluid resuscitation of 4-5 Liters, then can give empiric dexamethasone.  Type 2 DM:  -Carb modified diet -SSI  Hypothyroidism: -Continue home Synthroid 175 mcg daily  DVT ppx: Lovenox    Dispo: Disposition is deferred at this time, awaiting improvement of current medical problems. Anticipated discharge in approximately 2-3 day(s).   The patient does not know have a current PCP (No primary care provider on file.) and does not know need an [facility] hospital follow-up appointment after discharge.  The patient does not have transportation limitations that hinder transportation to clinic appointments.  Signed: Darreld Mclean, MD 02/07/2015, 4:42 PM

## 2015-02-07 NOTE — ED Notes (Signed)
Patient had back surgery 1 mth ago.  She has had ongoing pain and decreased appetite.  Patient with onset of syncope 1 week ago.  She is unable to tolerate po fluids.  She is having n/v.  Patient has attempted to see the surgeon but unable.  She was seen in ED in [place] and given fluids.  Patient was d/c home.  Patient had CT scans at that ED.  Patient presents with fatigue.  Complains of abd pain.  Patient is hypotensive in triage.  bp is 69/45/  Hr 130.  Patient denies chest pain.

## 2015-02-07 NOTE — H&P (Signed)
Internal Medicine Attending Admission Note  I saw and evaluated the patient. I reviewed the resident's note and I agree with the resident's findings and plan as documented in the resident's note.  Assessment & Plan by Problem:  Principal Problem:   ARF (acute renal failure) Active Problems:   Type 2 diabetes mellitus   Hypothyroidism   Acute renal failure  Acute Renal Failure:  Etiology could be ATN due to dehydration and hypotension versus allergic nephritis due to Bactrim. Unclear baseline renal function, awaiting outside records which have been requested. No evidence of urinary tract infection. Renal ultrasound is without hydronephrosis. - Would send urine electrolytes to look for fractional excretion of sodium.  - Urine microscopy to evaluate for casts. - Hydration overnight and recheck creatinine in the morning.  Possible Adrenal Insufficiency: Constellation of her symptoms, including nausea, abdominal pain, chronic fatigue, hypotension, and mild hyponatremia, consistent with adrenal insufficiency. Recent surgery 5 weeks ago and poor recovery afterwards also suggest adrenal insufficiency. On exam she has very dark tanned skin, denies significant sun exposure, has been this way for years, which could suggest elevated ACTH levels. I do not see evidence of adrenal crisis at this point as she has responded well to initial fluid resuscitation. If she has recurrent hypotension overnight, would give her dexamethasone empirically. Plan would be for morning cortisol tomorrow, and potentially cosyntropin stim later in the morning.   Chief Complaint(s): Nausea and vomiting  History - key components related to admission:  58 year old woman with a history of hypothyroidism and diabetes who was brought into the emergency department today by her brother-in-law because of persistent nausea and vomiting. Patient is visiting [address], she lives in [place]. Underwent a sciatic nerve release  back surgery about 5 weeks ago. Reports having significant amount of pain after the procedure. Also reports vague sensation of fatigue and listlessness. Says that her appetite has not returned. Pain currently is stable. Denies any lower extremity weakness. No urinary or bowel incontinence. Went to an urgent care about 2 weeks ago for a red boil that was under her right breast. Reports that she was given a prescription for Bactrim, completed this for about 7 days, last dose on Saturday. Over the past 4 days she has been increasing nausea and vomiting. Unable to keep food or water down. Feeling lightheaded. Denies any other fevers or chills. No new rashes on her skin.  Lab results: Reviewed in Epic  Physical Exam - key components related to admission:  Filed Vitals:   02/07/15 1410 02/07/15 1420 02/07/15 1600 02/07/15 1615  BP: 94/47 106/51 88/41 90/47   Pulse: 71 73 58 71  Temp:      TempSrc:      Resp: 15 21    SpO2: 100% 100% 93% 99%   Gen: Chronically ill-appearing woman, lying in bed, no distress CV: Regular rate and rhythm, no murmurs Lungs: Unlabored, clear to auscultation throughout Abd: Soft, nontender, nondistended, normal bowel sounds Ext: Warm and well-perfused, no edema Skin: Dark tan throughout her skin especially on her trunk, no rashes

## 2015-02-08 DIAGNOSIS — E119 Type 2 diabetes mellitus without complications: Secondary | ICD-10-CM

## 2015-02-08 DIAGNOSIS — E038 Other specified hypothyroidism: Secondary | ICD-10-CM

## 2015-02-08 LAB — BASIC METABOLIC PANEL
ANION GAP: 7 (ref 5–15)
BUN: 20 mg/dL (ref 6–20)
CALCIUM: 8.6 mg/dL — AB (ref 8.9–10.3)
CO2: 20 mmol/L — AB (ref 22–32)
Chloride: 110 mmol/L (ref 101–111)
Creatinine, Ser: 1.63 mg/dL — ABNORMAL HIGH (ref 0.44–1.00)
GFR calc non Af Amer: 33 mL/min — ABNORMAL LOW (ref >60)
GFR, EST AFRICAN AMERICAN: 39 mL/min — AB (ref >60)
Glucose, Bld: 75 mg/dL (ref 65–99)
POTASSIUM: 4.1 mmol/L (ref 3.5–5.1)
Sodium: 137 mmol/L (ref 135–145)

## 2015-02-08 LAB — GLUCOSE, CAPILLARY
GLUCOSE-CAPILLARY: 133 mg/dL — AB (ref 65–99)
Glucose-Capillary: 73 mg/dL (ref 65–99)
Glucose-Capillary: 118 mg/dL — ABNORMAL HIGH (ref 65–99)

## 2015-02-08 LAB — CORTISOL: Cortisol, Plasma: 1.8 ug/dL

## 2015-02-08 MED ORDER — HYDROCORTISONE 20 MG PO TABS
20.0000 mg | ORAL_TABLET | ORAL | Status: DC
  Administered 2015-02-08: 20 mg via ORAL
  Filled 2015-02-08 (×3): qty 1

## 2015-02-08 MED ORDER — MUPIROCIN 2 % EX OINT
1.0000 "application " | TOPICAL_OINTMENT | Freq: Two times a day (BID) | CUTANEOUS | Status: DC
  Administered 2015-02-08 – 2015-02-09 (×3): 1 via NASAL
  Filled 2015-02-08: qty 22

## 2015-02-08 MED ORDER — HYDROCORTISONE 20 MG PO TABS
40.0000 mg | ORAL_TABLET | Freq: Every day | ORAL | Status: DC
  Administered 2015-02-08 – 2015-02-09 (×2): 40 mg via ORAL
  Filled 2015-02-08 (×2): qty 2

## 2015-02-08 MED ORDER — CHLORHEXIDINE GLUCONATE CLOTH 2 % EX PADS
6.0000 | MEDICATED_PAD | Freq: Every day | CUTANEOUS | Status: DC
  Administered 2015-02-08 – 2015-02-09 (×2): 6 via TOPICAL

## 2015-02-08 NOTE — Progress Notes (Signed)
Subjective: Patient was feeling much better this morning and was able to eat and drink since yesterday. Her only complaint was that she did not want another IV since her other two had failed and didn't want to get stuck again. No headache, chills, nausea, vomiting, difficulty breathing or other complaints. Still reports some numbness in her toes.  Objective: Vital signs in last 24 hours: Filed Vitals:   02/08/15 0400 02/08/15 0455 02/08/15 0500 02/08/15 0700  BP:  107/52 115/49 117/41  Pulse:  69 69 77  Temp: 98.1 F (36.7 C)   98.2 F (36.8 C)  TempSrc: Oral   Oral  Resp:  16 20 14   Height:      Weight:      SpO2:  97% 99% 99%   Weight change:   Intake/Output Summary (Last 24 hours) at 02/08/15 0926 Last data filed at 02/08/15 0600  Gross per 24 hour  Intake   5375 ml  Output   1700 ml  Net   3675 ml   BP 117/41 mmHg  Pulse 77  Temp(Src) 98.2 F (36.8 C) (Oral)  Resp 14  Ht 5\' 4"  (1.626 m)  Wt 69.5 kg (153 lb 3.5 oz)  BMI 26.29 kg/m2  SpO2 99%  General Appearance:    Alert, cooperative, no distress, appears stated age  Head:    Normocephalic, without obvious abnormality, atraumatic  Lungs:     Clear to auscultation bilaterally, respirations unlabored  Chest Wall:    No tenderness or deformity   Heart:    Regular rate and rhythm, S1 and S2 normal, no murmur, rub   or gallop  Breast Exam:    No tenderness, 1x1 cm erythematous and slightly fluctuant lesion below R nipple, unchanged from yesterday  Abdomen:     Soft, non-tender, bowel sounds active all four quadrants,    no masses, no organomegaly  Extremities:   Extremities normal, atraumatic, no cyanosis or edema  Pulses:   2+ and symmetric all extremities  Skin:   Skin color, texture, turgor normal, no rashes or lesions except as noted above   Lab Results: Results for orders placed or performed during the hospital encounter of 02/07/15 (from the past 24 hour(s))  CBG monitoring, ED     Status: Abnormal   Collection Time: 02/07/15 11:58 AM  Result Value Ref Range   Glucose-Capillary 155 (H) 65 - 99 mg/dL  CBC with Differential     Status: Abnormal   Collection Time: 02/07/15 12:23 PM  Result Value Ref Range   WBC 12.8 (H) 4.0 - 10.5 K/uL   RBC 4.04 3.87 - 5.11 MIL/uL   Hemoglobin 14.3 12.0 - 15.0 g/dL   HCT 16.1 09.6 - 04.5 %   MCV 98.5 78.0 - 100.0 fL   MCH 35.4 (H) 26.0 - 34.0 pg   MCHC 35.9 30.0 - 36.0 g/dL   RDW 40.9 81.1 - 91.4 %   Platelets 244 150 - 400 K/uL   Neutrophils Relative % 71 43 - 77 %   Neutro Abs 9.1 (H) 1.7 - 7.7 K/uL   Lymphocytes Relative 23 12 - 46 %   Lymphs Abs 2.9 0.7 - 4.0 K/uL   Monocytes Relative 5 3 - 12 %   Monocytes Absolute 0.6 0.1 - 1.0 K/uL   Eosinophils Relative 1 0 - 5 %   Eosinophils Absolute 0.1 0.0 - 0.7 K/uL   Basophils Relative 0 0 - 1 %   Basophils Absolute 0.1 0.0 - 0.1 K/uL  Comprehensive metabolic panel     Status: Abnormal   Collection Time: 02/07/15 12:23 PM  Result Value Ref Range   Sodium 134 (L) 135 - 145 mmol/L   Potassium 4.2 3.5 - 5.1 mmol/L   Chloride 97 (L) 101 - 111 mmol/L   CO2 22 22 - 32 mmol/L   Glucose, Bld 158 (H) 65 - 99 mg/dL   BUN 32 (H) 6 - 20 mg/dL   Creatinine, Ser 1.61 (H) 0.44 - 1.00 mg/dL   Calcium 09.6 8.9 - 04.5 mg/dL   Total Protein 7.2 6.5 - 8.1 g/dL   Albumin 4.2 3.5 - 5.0 g/dL   AST 17 15 - 41 U/L   ALT 12 (L) 14 - 54 U/L   Alkaline Phosphatase 82 38 - 126 U/L   Total Bilirubin 0.8 0.3 - 1.2 mg/dL   GFR calc non Af Amer 13 (L) >60 mL/min   GFR calc Af Amer 15 (L) >60 mL/min   Anion gap 15 5 - 15  Lipase, blood     Status: None   Collection Time: 02/07/15 12:23 PM  Result Value Ref Range   Lipase 39 22 - 51 U/L  I-Stat CG4 Lactic Acid, ED     Status: Abnormal   Collection Time: 02/07/15 12:56 PM  Result Value Ref Range   Lactic Acid, Venous 2.66 (HH) 0.5 - 2.0 mmol/L   Comment NOTIFIED PHYSICIAN   Urinalysis, Routine w reflex microscopic (not at [facility])     Status: Abnormal   Collection  Time: 02/07/15  1:14 PM  Result Value Ref Range   Color, Urine YELLOW YELLOW   APPearance CLOUDY (A) CLEAR   Specific Gravity, Urine 1.007 1.005 - 1.030   pH 6.5 5.0 - 8.0   Glucose, UA NEGATIVE NEGATIVE mg/dL   Hgb urine dipstick NEGATIVE NEGATIVE   Bilirubin Urine NEGATIVE NEGATIVE   Ketones, ur NEGATIVE NEGATIVE mg/dL   Protein, ur 30 (A) NEGATIVE mg/dL   Urobilinogen, UA 0.2 0.0 - 1.0 mg/dL   Nitrite NEGATIVE NEGATIVE   Leukocytes, UA NEGATIVE NEGATIVE  Sodium, urine, random     Status: None   Collection Time: 02/07/15  1:14 PM  Result Value Ref Range   Sodium, Ur 64 mmol/L  Creatinine, urine, random     Status: None   Collection Time: 02/07/15  1:14 PM  Result Value Ref Range   Creatinine, Urine 58.61 mg/dL  Urine microscopic-add on     Status: Abnormal   Collection Time: 02/07/15  1:14 PM  Result Value Ref Range   Squamous Epithelial / LPF MANY (A) RARE   WBC, UA 0-2 <3 WBC/hpf   Bacteria, UA FEW (A) RARE   Casts GRANULAR CAST (A) NEGATIVE   Crystals CA OXALATE CRYSTALS (A) NEGATIVE  MRSA PCR Screening     Status: Abnormal   Collection Time: 02/07/15  8:19 PM  Result Value Ref Range   MRSA by PCR POSITIVE (A) NEGATIVE  Glucose, capillary     Status: Abnormal   Collection Time: 02/07/15  9:56 PM  Result Value Ref Range   Glucose-Capillary 224 (H) 65 - 99 mg/dL  Basic metabolic panel     Status: Abnormal   Collection Time: 02/08/15  6:25 AM  Result Value Ref Range   Sodium 137 135 - 145 mmol/L   Potassium 4.1 3.5 - 5.1 mmol/L   Chloride 110 101 - 111 mmol/L   CO2 20 (L) 22 - 32 mmol/L   Glucose, Bld 75 65 - 99  mg/dL   BUN 20 6 - 20 mg/dL   Creatinine, Ser 1.61 (H) 0.44 - 1.00 mg/dL   Calcium 8.6 (L) 8.9 - 10.3 mg/dL   GFR calc non Af Amer 33 (L) >60 mL/min   GFR calc Af Amer 39 (L) >60 mL/min   Anion gap 7 5 - 15  Cortisol     Status: None   Collection Time: 02/08/15  6:25 AM  Result Value Ref Range   Cortisol, Plasma 1.8 ug/dL  Glucose, capillary      Status: None   Collection Time: 02/08/15  7:52 AM  Result Value Ref Range   Glucose-Capillary 73 65 - 99 mg/dL   Micro Results: Recent Results (from the past 240 hour(s))  MRSA PCR Screening     Status: Abnormal   Collection Time: 02/07/15  8:19 PM  Result Value Ref Range Status   MRSA by PCR POSITIVE (A) NEGATIVE Final    Comment:        The GeneXpert MRSA Assay (FDA approved for NASAL specimens only), is one component of a comprehensive MRSA colonization surveillance program. It is not intended to diagnose MRSA infection nor to guide or monitor treatment for MRSA infections. RESULT CALLED TO, READ BACK BY AND VERIFIED WITH: Joya Salm RN [id number] 02/07/15 A BROWNING    Studies/Results: US Renal  02/07/2015   CLINICAL DATA:  Acute renal failure.  Diabetes.  EXAM: RENAL / URINARY TRACT ULTRASOUND COMPLETE  COMPARISON:  None.  FINDINGS: Right Kidney:  Length: 11.1 cm. Echogenicity within normal limits. No mass or hydronephrosis visualized.  Left Kidney:  Length: 10.7 cm. Echogenicity within normal limits. No mass or hydronephrosis visualized. 1.2 cm cyst.  Bladder:  Appears normal for degree of bladder distention. Bilateral ureteral jets visualized.  IMPRESSION: No evidence of hydronephrosis.  1.2 cm left renal cyst.   Electronically Signed   By: Elberta Fortis M.D.   On: 02/07/2015 15:21   Medications: I have reviewed the patient's current medications. Scheduled Meds: . Chlorhexidine Gluconate Cloth  6 each Topical Q0600  . enoxaparin (LOVENOX) injection  40 mg Subcutaneous Q24H  . insulin aspart  0-5 Units Subcutaneous QHS  . insulin aspart  0-9 Units Subcutaneous TID WC  . levothyroxine  175 mcg Oral QAC breakfast  .  morphine injection  2 mg Intravenous Once  . mupirocin ointment  1 application Nasal BID  . sodium chloride  3 mL Intravenous Q12H   Continuous Infusions: . sodium chloride 125 mL/hr at 02/07/15 2332   PRN Meds:.sodium chloride, acetaminophen **OR** acetaminophen,  morphine injection, ondansetron **OR** ondansetron (ZOFRAN) IV, sodium chloride Assessment/Plan: Principal Problem:   ARF (acute renal failure) Active Problems:   Type 2 diabetes mellitus   Hypothyroidism   Acute renal failure  ARF (acute renal failure): Records from outside hospital show SCr of 1.35 five days ago. Leading the diferential would be ATN from dehydration given her 4 day history of N/V and decreased intake. AIN due to Bactrim administration is also a possibility given the proximity of the recent prescription and onset of N/V a few days into the course. - 0.9% NS fluid bolus resuscitation, 5.3 L given yesterday - SCr this morning 1.63, down from 3.64 yesterday  General Malaise: Given the patient's onset right after surgery and recent hypotension, adrenal insufficiency is a potential explanation for the patient's constellation of symptoms. Low morning cortisol supports this diagnosis, as well as her concurrent DM and thyroid disease - AM cortisol low at 1.8 - ACTH  level pending - Onansetron  IV PRN for nausea - Na corrected with fluid administration  Cellulitis:  - Nochange since yesterday, will hold abx - F/u with PCP  Hypothyroidism: - COntinue home synthroid   Diabetes Mellitus: - Hold home amaril - SSI  FEN/GI: - Diet: Carb modified - Ondansetron PRN for nausea  Prophylaxis: - Lovenox  SQ daily  Dispo: Disposition is deferred at this time until patient condition improves. Anticipate discharge in 1-3 days.   This is a Psychologist, occupational Note.  The care of the patient was discussed with Dr. Darreld Mclean and the assessment and plan formulated with their assistance.  Please see their attached note for official documentation of the daily encounter.     Eyvonne Mechanic, Med Student 02/08/2015, 9:26 AM

## 2015-02-08 NOTE — Progress Notes (Signed)
Patient arrived to [phone] [id number] with no complaints of pain, n/v. Tele placed and vitals taken. Will continue to monitor. Zenon Leaf, [place] Scrape, RN

## 2015-02-08 NOTE — Progress Notes (Signed)
   Subjective: Patient is feeling well and states that she would like to discontinue her IV fluids. She states that she will be able to tolerate oral medications. Objective: Vital signs in last 24 hours: Filed Vitals:   02/08/15 0400 02/08/15 0455 02/08/15 0500 02/08/15 0700  BP:  107/52 115/49 117/41  Pulse:  69 69 77  Temp: 98.1 F (36.7 C)   98.2 F (36.8 C)  TempSrc: Oral   Oral  Resp:  Height:      Weight:      SpO2:  97% 99% 99%   Weight change:   Intake/Output Summary (Last 24 hours) at 02/08/15 1610 Last data filed at 02/08/15 0600  Gross per 24 hour  Intake   5375 ml  Output   1700 ml  Net   3675 ml   General: resting in bed Cardiac: RRR, no rubs, murmurs or gallops Pulm: clear to auscultation bilaterally, moving normal volumes of air Abd: soft, nontender, nondistended, BS present Ext: warm and well perfused, no pedal edema Skin: tanned complexion  Assessment/Plan:  Acute Renal Failure: Patient's creatinine improved to 1.63 from 3.64 yesterday after receiving nearly 5 liters of fluid since admission. Her AM cortisol this morning is 1.8 suggesting that her acute renal failure and general weakness/lethargy and symptoms of nausea/vomiting is due to adrenal insufficiency. It is likely that this has been a long-term process that is recently exacerbated s/p back surgery on 11/26/2014. Her urine sodium from last night is 64 mmol/L. -Will check ACTH level to assess whether this is a primary or central process -Start Hydrocortisone 40 mg in the morning and 20 mg in the evening -D/C IV fluids and monitor blood pressures, may need to restart if BPs drop -Transfer to telemetry   DVT ppx: Lovenox    Dispo: Disposition is deferred at this time, awaiting improvement of current medical problems.  Anticipated discharge in approximately 1 day(s).   The patient does not know have a current PCP (No primary care provider on file.) and does need an [facility] hospital follow-up  appointment after discharge.  The patient does not have transportation limitations that hinder transportation to clinic appointments.  .Services Needed at time of discharge: Y = Yes, Blank = No PT:   OT:   RN:   Equipment:   Other:       Darreld Mclean, MD 02/08/2015, 9:37 AM

## 2015-02-09 DIAGNOSIS — N170 Acute kidney failure with tubular necrosis: Secondary | ICD-10-CM

## 2015-02-09 DIAGNOSIS — E2740 Unspecified adrenocortical insufficiency: Secondary | ICD-10-CM

## 2015-02-09 LAB — BASIC METABOLIC PANEL
ANION GAP: 7 (ref 5–15)
BUN: 16 mg/dL (ref 6–20)
CO2: 26 mmol/L (ref 22–32)
Calcium: 9.4 mg/dL (ref 8.9–10.3)
Chloride: 104 mmol/L (ref 101–111)
Creatinine, Ser: 1.24 mg/dL — ABNORMAL HIGH (ref 0.44–1.00)
GFR calc Af Amer: 54 mL/min — ABNORMAL LOW (ref >60)
GFR calc non Af Amer: 47 mL/min — ABNORMAL LOW (ref >60)
GLUCOSE: 143 mg/dL — AB (ref 65–99)
POTASSIUM: 3.5 mmol/L (ref 3.5–5.1)
Sodium: 137 mmol/L (ref 135–145)

## 2015-02-09 LAB — GLUCOSE, CAPILLARY
Glucose-Capillary: 110 mg/dL — ABNORMAL HIGH (ref 65–99)
Glucose-Capillary: 146 mg/dL — ABNORMAL HIGH (ref 65–99)

## 2015-02-09 LAB — ACTH: C206 ACTH: 19.7 pg/mL (ref 7.2–63.3)

## 2015-02-09 MED ORDER — HYDROCORTISONE 10 MG PO TABS: 20.0000 mg | ORAL_TABLET | ORAL | Status: DC

## 2015-02-09 NOTE — Progress Notes (Signed)
Pt. Being discharge and needs f/u appointment with internal medicine. NS unable to make appointment, when she call was informed that resident needed to make appointment.  Paged [phone], will await for return call.  Forbes Cellar, RN

## 2015-02-09 NOTE — Progress Notes (Signed)
Still no return call back from MD, paged again.  Pt. & family ready to go.  Will d/c to home and informed pt. if they don't call her in next couple of days, to call office.  Forbes Cellar, RN

## 2015-02-09 NOTE — Discharge Summary (Signed)
Name: Courtney Bonilla MRN: [id number] DOB: 16-Aug-1955 59 y.o. PCP: No primary care provider on file.  Date of Admission: 02/07/2015 11:55 AM Date of Discharge: 02/09/2015 Attending Physician: No att. providers found  Discharge Diagnosis: 1. Adrenal insufficiency Principal Problem:   Adrenal insufficiency Active Problems:   ARF (acute renal failure)   Type 2 diabetes mellitus   Hypothyroidism   Acute renal failure  Discharge Medications:   Medication List    STOP taking these medications        sulfamethoxazole-trimethoprim 800-160 MG per tablet  Commonly known as:  BACTRIM DS,SEPTRA DS      TAKE these medications        baclofen 10 MG tablet  Commonly known as:  LIORESAL  Take 10 mg by mouth 3 (three) times daily.     hydrocortisone 10 MG tablet  Commonly known as:  CORTEF  Take 2 tablets (20 mg total) by mouth as directed. Take 2 tablets (20 mg total) in the morning & 1 tablet (10 mg total) in the evening daily.     levothyroxine 175 MCG tablet  Commonly known as:  SYNTHROID, LEVOTHROID  Take 175 mcg by mouth daily before breakfast.        Disposition and follow-up:   CourtneyCourtney Bonilla was discharged from [facility] in Good condition.  At the hospital follow up visit please address:  1.  Adrenal insufficiency: Please follow up on patient's symptoms and if she is continuing to take her hydrocortisone as prescribed 20 mg in the morning and 10 mg in the evening. She will also need referral to Endocrinology for further management considering she also has thyroid and pancreatic (DM) diseases.  HTN:  Patient's blood pressures have been low during hospital stay and she is advised to hold her Lisinopril until follow up appointment.  2.  Labs / imaging needed at time of follow-up: BMP  3.  Pending labs/ test needing follow-up: ACTH  Follow-up Appointments: 02/18/2015 3:15 pm at [facility] Follow-up Information    Follow  up with Darreld Mclean, MD.   Specialty:  Internal Medicine   Contact information:   [address] [place] [address] [phone]       Discharge Instructions: Discharge Instructions    Call MD for:  extreme fatigue    Complete by:  As directed      Call MD for:  persistant dizziness or light-headedness    Complete by:  As directed      Call MD for:  persistant nausea and vomiting    Complete by:  As directed      Call MD for:  severe uncontrolled pain    Complete by:  As directed      Diet - low sodium heart healthy    Complete by:  As directed      Increase activity slowly    Complete by:  As directed            Consultations:    Procedures Performed:  US Renal  02/07/2015   CLINICAL DATA:  Acute renal failure.  Diabetes.  EXAM: RENAL / URINARY TRACT ULTRASOUND COMPLETE  COMPARISON:  None.  FINDINGS: Right Kidney:  Length: 11.1 cm. Echogenicity within normal limits. No mass or hydronephrosis visualized.  Left Kidney:  Length: 10.7 cm. Echogenicity within normal limits. No mass or hydronephrosis visualized. 1.2 cm cyst.  Bladder:  Appears normal for degree of bladder distention. Bilateral ureteral jets visualized.  IMPRESSION: No evidence of hydronephrosis.  1.2 cm left renal cyst.   Electronically Signed   By: Elberta Fortis M.D.   On: 02/07/2015 15:21    2D Echo: n/a  Cardiac Cath: n/a  Admission HPI: Courtney Bonilla is a 59 year old lady with PMH of hypothyroidism, DM, HTN, and sciatica s/p back surgery on 11/26/14 who presents to the ED with complaints of nausea, vomiting, and generalized weakness. She states that she has been feeling generally unwell and weak since her surgery. She went to the ED of [facility] in [place] near where she lives for similar symptoms. She was given IV fluids and discharged home on a course of Bactrim for a small area of cellulitis on her right breast. She states that she started taking Bactrim last Wednesday 8/17 and on  Friday 8/19 she began to have nausea and vomiting. Her last dose of Bactrim was on Saturday 8/20 which she discontinued as she attributed it to her symptoms. She has not been able to hold any food or liquids down. She tried Pepto-Bismal which she also could not keep down. She does endorse dizziness and lightheadedness but denies falling or syncope. Her appetite is decreased and she denies any recent changes in weight. She denies dysuria, diarrhea, fever, chills, cough, SOB, or chest pain. She smokes 1 PPD.  Vitals on arrival: BP 69/45, Pulse 130, RR 32, Temp 97.6, SpO2 100% room air.  Hospital Course by problem list:  Adrenal insufficiency: The patient was noted to be very hypotensive in the ED, and was started on IV NS 0.9% boluses. She received approximately 5.3 liters of fluid total. Her blood pressure rose from 69/45 initially to 117/41 the next day. Given her history of thyroid disease and diabetes along with this chronic malaise after her surgery, a morning cortisol level was checked and shown to be low at 1.8 mcg/dL. An ACTH level taken after that showed 19.7. The patient was initially given stress dose hydrocortisone of 40mg  in the morning and 20mg  at night. She was discharged on a regiment of 20mg  in the morning and 10mg  at night feeling much better than she has for several months.    Acute renal failure: Patient had acute renal failure in setting of hypovolemia due to emesis and dehydration. Creatinine on admission of 3.64 improved quickly with IV NS hydration to 1.24 prior to discharge.   Discharge Vitals:   BP 123/60 mmHg  Pulse 58  Temp(Src) 98.2 F (36.8 C) (Oral)  Resp 18  Ht 5\' 4"  (1.626 m)  Wt 154 lb 12.2 oz (70.2 kg)  BMI 26.55 kg/m2  SpO2 100%  Discharge Labs:  Results for orders placed or performed during the hospital encounter of 02/07/15 (from the past 24 hour(s))  Glucose, capillary     Status: Abnormal   Collection Time: 02/08/15  8:38 PM  Result Value Ref Range    Glucose-Capillary 118 (H) 65 - 99 mg/dL  Glucose, capillary     Status: Abnormal   Collection Time: 02/09/15  7:42 AM  Result Value Ref Range   Glucose-Capillary 110 (H) 65 - 99 mg/dL  Basic metabolic panel     Status: Abnormal   Collection Time: 02/09/15  8:35 AM  Result Value Ref Range   Sodium 137 135 - 145 mmol/L   Potassium 3.5 3.5 - 5.1 mmol/L   Chloride 104 101 - 111 mmol/L   CO2 26 22 - 32 mmol/L   Glucose, Bld 143 (H) 65 -  99 mg/dL   BUN 16 6 - 20 mg/dL   Creatinine, Ser 1.61 (H) 0.44 - 1.00 mg/dL   Calcium 9.4 8.9 - 09.6 mg/dL   GFR calc non Af Amer 47 (L) >60 mL/min   GFR calc Af Amer 54 (L) >60 mL/min   Anion gap 7 5 - 15  Glucose, capillary     Status: Abnormal   Collection Time: 02/09/15 12:16 PM  Result Value Ref Range   Glucose-Capillary 146 (H) 65 - 99 mg/dL    Signed: Darreld Mclean, MD 02/09/2015, 6:40 PM    Services Ordered on Discharge: none Equipment Ordered on Discharge: none

## 2015-02-09 NOTE — Discharge Instructions (Signed)
We will discharge you with oral steroid medication called hydrocortisone. You should take 20 mg in the morning and 10 mg in the evening.  You may hold your blood pressure medication for now until you follow up with [place] in the internal medicine clinic. I will schedule you an appointment.  I will send your prescriptions to the [facility] on [address].  Addison Disease Addison disease is a glandular (endocrine) or hormonal disorder. It is also called adrenal insufficiency. It affects about 1 in 100,000 people. It can affect men and women in all age groups. This disease occurs when the adrenal glands do not make enough of the hormone cortisol. In some cases, the adrenal glands also do not make the hormonealdosterone. Without the right levels of these hormones, your body cannot maintain critical life functions. The adrenal glands are located just above the kidneys. Cortisol is a steroid hormone. Its most important job is to help the body respond to stress. Cortisol also helps the body to:  Maintain blood pressure and heart (cardiovascular) function.  Slow the immune system's response to inflammation.  Control the use of proteins, carbohydrates (sugars), and fats.  Maintain a sense of well-being. CAUSES  A lack of cortisol can happen for different reasons.   Primary adrenal insufficiency is Addison disease. The adrenal glands do not produce enough, or any, cortisol. Usually, aldosterone is also not produced.  Secondary adrenal insufficiency. The pituitary gland may not make enough of a hormone called ACTH (adrenocorticotropin). This hormone causes the adrenal glands to produce cortisol. Usually, there is enough aldosterone. Primary Adrenal Insufficiency can be caused by:  Autoimmune disease. Your body can produce antibodies that attack its own organs (in this case, your adrenal glands). The reasons why this happens are not well understood at this time. Sometimes, other organs are also affected  (the polyglandular autoimmune syndromes).  An infection of the adrenal glands. Possible causes include tuberculosis, viruses (including HIV), and fungal infections. Secondary Adrenal Insufficiency This form is much more common than the primary form. It can be traced to a lack of ACTH. Without ACTH, the adrenal glands cannot make cortisol. Causes include:  Diseases of the pituitary gland. This is a small gland in the brain that controls many important body functions. The pituitary gland produces ACTH, among other hormones. Pituitary gland tumors, injury, or surgery can cause inadequate ACTH production, which causes inadequate cortisol production.  Medications:  Megestrol (used for cancer treatment and to stimulate appetite) and some pain medications can impair production of cortisol.  Use of cortisol medication (steroids) causes your adrenal glands to not produce cortisol. When you stop this medication, it can take time for the adrenal glands to start producing cortisol again. This can be a dangerous situation, requiring slowly reducing your cortisol medicine. SYMPTOMS  Symptoms of adrenal insufficiency normally begin slowly. Problems seen with the disease are:  Severe tiredness (fatigue).  Muscle weakness.  Loss of appetite.  Weight loss. About 50% of the time, the following symptoms occur:   Nausea, vomiting and diarrhea.  Drops in blood pressure.  Dizziness or fainting.  Darkening of the skin (with primary disease only).  Being easily angered (irritable).  Depression.  Salt craving.  Low blood sugar (hypoglycemia).  Irregular or no menstrual periods. The symptoms slowly get worse. They are often ignored until a stressful event like an illness or an accident occurs. This is called an Addisonian crisis, or acute adrenal insufficiency. Without treatment, an Addisonian crisis can cause death. Symptoms of an Addisonian  crisis include:  Sudden, severe pain in the lower back,  abdomen, or legs.  Severe vomiting and diarrhea.  Dehydration.  Low blood pressure.  Loss of consciousness. DIAGNOSIS  In its early stages, adrenal insufficiency can be hard to diagnose. Your caregiver will need to:  Review your medical history.  Review your symptoms, such as dark tanning of the skin.  Perform lab tests. Results will show if levels of cortisol are too low, and will help identify the cause. CT scan or MRI scan of the adrenal and pituitary glands may also be necessary. TREATMENT  With proper treatment, you can live a normal life with Addison disease or adrenal insufficiency.  Missing hormones need to be replaced in order to treat Addison disease. Cortisol is replaced with hydrocortisone tablets taken by mouth. Other forms of cortisol may also be used. Since cortisone levels normally are higher in the morning and lower in the evening, you may need different doses at different times of the day. Be sure to follow your instructions carefully.  If your body cannot maintain the right levels of salt (sodium) and fluids because of too little aldosterone, you will also be given a medication. This drug replaces aldosterone. Important points about treatment:  Any sudden (acute) illness can increase your body's need for cortisol. Surgery, or other stress on the body, can do the same. If you have Addison disease and you are ill or having surgery, you will need an increase in hormone medication to prevent an Addisonian crisis. Untreated, an Addisonian crisis can cause death.  If you are too ill to take your medication or you cannot keep it down, you must take medicine through a shot (injection). You or someone who lives with you will need to learn how to give you this injection. The shot will take the place of hydrocortisone. If you find it necessary to give yourself injectable medication, call your caregiver right away, or go to the nearest hospital emergency room. HOME CARE  INSTRUCTIONS   Always carry an identification card stating your condition in case of an emergency. The card should:  Alert emergency personnel about the need to inject 100 mg of cortisol if you are severely hurt or cannot respond.  Include your caregiver's name and phone number.  Include the name and number of your closest relative to contact.  When traveling, carry a needle, syringe, and an injectable form of cortisol for emergencies.  Know how to increase medication during periods of stress or mild colds.  Wear a warning bracelet or neck chain to alert emergency personnel. Many companies sell medical ID products.  Take your medications as prescribed. Do not stop your medications without medical supervision.  Learn about your condition. Ask your caregiver for further resources. This is a potentially dangerous, but easily managed illness. SEEK MEDICAL CARE IF:   You have Addison disease and you are in need of surgery.  You have Addison disease and you have an acute illness.  You have weakness, weight loss, or other unexplained symptoms. SEEK IMMEDIATE MEDICAL CARE IF:   You have symptoms of crisis:  Sudden, severe pain in the lower back, abdomen, or legs.  Severe vomiting and diarrhea.  Dehydration.  Low blood pressure.  Loss of consciousness.  You are experiencing severe:  Infections or other illness.  Vomiting.  Diarrhea. Document Released: 06/04/2005 Document Revised: 10/19/2013 Document Reviewed: 10/26/2008 [facility] Patient Information 2015 [place], [place]. This information is not intended to replace advice given to you by your health  care provider. Make sure you discuss any questions you have with your health care provider.

## 2015-02-09 NOTE — Care Management Note (Addendum)
Case Management Note  Patient Details  Name: Courtney Bonilla MRN: [id number] Date of Birth: Jun 18, 1956  Subjective/Objective:       Patient is from [place],  Per insurance verification she has [employer]BS of [place], she is independent, her brother in law is here today to transport her home.  She will follow up with internal medicine clinic.  Patient states she will not need any ast with medicaitons unless they are expensive,  Per MD states he will dc her on steroids which will not be expensive.  No other needs identified.              Action/Plan:   Expected Discharge Date:                  Expected Discharge Plan:  Home/Self Care  In-House Referral:     Discharge planning Services  CM Consult  Post Acute Care Choice:    Choice offered to:     DME Arranged:    DME Agency:     HH Arranged:    HH Agency:     Status of Service:  Completed, signed off  Medicare Important Message Given:    Date Medicare IM Given:    Medicare IM give by:    Date Additional Medicare IM Given:    Additional Medicare Important Message give by:     If discussed at Long Length of Stay Meetings, dates discussed:    Additional Comments:  Leone Haven, RN 02/09/2015, 11:10 AM

## 2015-02-09 NOTE — Progress Notes (Signed)
   Subjective: Patient is feeling much better than she has in at least 2 months. She is eating and drinking well without complaints. Will discharge today and follow up in [facility]. Objective: Vital signs in last 24 hours: Filed Vitals:   02/08/15 1252 02/08/15 1814 02/08/15 2022 02/09/15 0537  BP: 123/52 127/55 124/64 125/61  Pulse: 65 68 77 56  Temp: 97.5 F (36.4 C) 98.2 F (36.8 C) 99 F (37.2 C) 98.8 F (37.1 C)  TempSrc: Oral Oral Oral Oral  Resp: Height:    (1.626 m)   Weight:   154 lb 12.2 oz (70.2 kg)   SpO2: 98% 98% 100% 99%   Weight change: 1 lb 8.7 oz (0.7 kg)  Intake/Output Summary (Last 24 hours) at 02/09/15 1305 Last data filed at 02/09/15 1052  Gross per 24 hour  Intake   1860 ml  Output    400 ml  Net   1460 ml   General: resting in bed Cardiac: RRR, no rubs, murmurs or gallops Pulm: clear to auscultation bilaterally, moving normal volumes of air Abd: soft, nontender, nondistended, BS present Ext: warm and well perfused, no pedal edema Skin: tanned complexion  Assessment/Plan:  Acute Renal Failure: Patient's acute renal failure is likely 2/2 to hypovolemia due to emesis. Her creatinine at baseline according to prior records was around 1.3. Creatinine improved quickly with hydration with IV fluids with value today of 1.24.  Adrenal insufficiency: Patient has felt better today than she has in quite some time since starting her o oral hydrocortisone. -Will discharge on oral hydrocortisone 20 mg in the morning and 10 mg in the evening. -f/u ACTH -f/u with [place] in outpatient clinic to check blood pressures, bmp, and referral to endocrinology. -Hold home BP meds until follow up     Dispo: Disposition is deferred at this time, awaiting improvement of current medical problems.  Anticipated discharge in approximately 1 day(s).   The patient does not have a current PCP (No primary care provider on file.) and does need an [facility] hospital follow-up  appointment after discharge.  The patient does not have transportation limitations that hinder transportation to clinic appointments.  .Services Needed at time of discharge: Y = Yes, Blank = No PT:   OT:   RN:   Equipment:   Other:     LOS: 1 day   Darreld Mclean, MD 02/09/2015, 1:05 PM

## 2015-02-09 NOTE — Progress Notes (Signed)
Adyline Galindo discharged Home with sister & brother-in-law per MD order.  Discharge instructions reviewed and discussed with the patient, all questions and concerns answered. Copy of instructions, care notes for new medications &diagnosis and scripts given to patient.    Medication List    STOP taking these medications        sulfamethoxazole-trimethoprim 800-160 MG per tablet  Commonly known as:  BACTRIM DS,SEPTRA DS      TAKE these medications        baclofen 10 MG tablet  Commonly known as:  LIORESAL  Take 10 mg by mouth 3 (three) times daily.     hydrocortisone 10 MG tablet  Commonly known as:  CORTEF  Take 2 tablets (20 mg total) by mouth as directed. Take 2 tablets (20 mg total) in the morning & 1 tablet (10 mg total) in the evening daily.     levothyroxine 175 MCG tablet  Commonly known as:  SYNTHROID, LEVOTHROID  Take 175 mcg by mouth daily before breakfast.        Patients skin is clean, dry and intact, no evidence of skin break down.   Patient escorted to car by Derryl Harbor, RN ambulating,  no distress noted upon discharge.  Jachin Coury C 02/09/2015 1:02 PM2

## 2015-02-10 LAB — GLUCOSE, CAPILLARY: GLUCOSE-CAPILLARY: 260 mg/dL — AB (ref 65–99)

## 2015-02-17 ENCOUNTER — Telehealth: Payer: Self-pay | Admitting: Internal Medicine

## 2015-02-17 NOTE — Telephone Encounter (Signed)
Call to patient to confirm appointment for 02/18/15 at 3:15 lmtcb

## 2015-02-18 ENCOUNTER — Ambulatory Visit (INDEPENDENT_AMBULATORY_CARE_PROVIDER_SITE_OTHER): Payer: BLUE CROSS/BLUE SHIELD | Admitting: Internal Medicine

## 2015-02-18 ENCOUNTER — Encounter: Payer: Self-pay | Admitting: Internal Medicine

## 2015-02-18 VITALS — BP 156/75 | HR 96 | Temp 98.1°F | Ht 64.0 in | Wt 146.6 lb

## 2015-02-18 DIAGNOSIS — E2740 Unspecified adrenocortical insufficiency: Secondary | ICD-10-CM | POA: Diagnosis not present

## 2015-02-18 DIAGNOSIS — E1159 Type 2 diabetes mellitus with other circulatory complications: Secondary | ICD-10-CM | POA: Insufficient documentation

## 2015-02-18 DIAGNOSIS — I10 Essential (primary) hypertension: Secondary | ICD-10-CM

## 2015-02-18 MED ORDER — HYDROCORTISONE 10 MG PO TABS: 20.0000 mg | ORAL_TABLET | ORAL | Status: DC

## 2015-02-18 NOTE — Assessment & Plan Note (Signed)
Patient with hypothyroidism and diabetes recently admitted to hospital with several weeks history of nausea, vomiting, general malaise, and hypotension found to have a new diagnosis of adrenal insufficiency. Her AM cortisol was low at 1.8 mcg/dL. ACTH found to be 19.7 which is WNL and curious as we would expect either a compensatory high level in primary adrenal disease and a low ACTH in a central disease. Patient currently on hydrocortisone 20 mg in the am and 10 mg that she takes at 8:30 pm. She feels much improved now that she is on hydrocortisone but does complain of continued malaise/low energy and poor appetite which is corroborated by her brother. Patient and brother curious if hydrocortisone dose should be adjusted. -Patient advised to continue current dose of hydrocortisone 20 mg in the AM and 10 mg in the PM, but to take PM dose earlier at 2-3 PM -Patient referred to Endocrinologist near her hometown [place], [place] (Dr. Cecilie Lowers Felsinger) with appointment on Friday March 25, 2015 to arrive at 10:00 am with current photo ID, insurance card, and medications in original bottles. Patient told about appointment and agrees to attend -Patient given refill prescription for hydrocortisone until she is able to see Endocrinologist -Check BMP for electrolytes and renal function -Check TSH to see if Levothyroxine dose is appropriate and symptoms of malaise/low appetite

## 2015-02-18 NOTE — Patient Instructions (Signed)
It is good to see you again Courtney Bonilla. I'm glad you are feeling better. I know you are having decreased energy and appetite, but for now we would like to continue your hydrocortisone at the same dose.  Please take Hydrocortisone 20 mg in the morning as you are from 6:30-7:00 am and take your 10 mg dose around 2-3 PM.  I will call [name] office to try to get you an appointment there.  I have printed a prescription to refill your hydrocortisone.  We will do a couple blood tests to check your thyroid and kidney function as well as electrolytes.  You may continue your Lisinopril. If you should feel lightheaded and dehydrated, please hold your lisinopril. Try to stay hydrated.  If you should begin feeling worse, with symptoms similar to those that brought you to [facility], please go to the hospital.

## 2015-02-18 NOTE — Assessment & Plan Note (Signed)
  BP Readings from Last 3 Encounters:  02/18/15 156/75  02/09/15 123/60   Patient previously on Lisinopril 10 mg daily which was discontinued as she was hypotensive during hospital stay. She has not taken since discharge and has BP today of 156/75. -Patient advised that she may continue her Lisinopril but should hold should she feel lightheaded or dehydrated. She should keep hydrated and states that she will try.

## 2015-02-18 NOTE — Progress Notes (Signed)
Patient ID: Courtney Bonilla, female   DOB: 1956/06/17, 59 y.o.   MRN: [id number]   Subjective:   Patient ID: Courtney Bonilla female   DOB: 1956-02-14 59 y.o.   MRN: [id number]  HPI: [name] is a 59 y.o. lady with PMH of T2DM, hypothyroidism, and sciatica s/p back surgery on 11/26/14 who presents for HFU after new diagnosis of adrenal insufficiency. She feels much improved since hospital visit but does continue to have complaints of decreased appetite and low energy/malaise.  Please see problem list for details.    Past Medical History  Diagnosis Date  . Diabetes mellitus without complication   . Back pain   . Thyroid disease    Current Outpatient Prescriptions  Medication Sig Dispense Refill  . baclofen (LIORESAL) 10 MG tablet Take 10 mg by mouth 3 (three) times daily.    . hydrocortisone (CORTEF) 10 MG tablet Take 2 tablets (20 mg total) by mouth as directed. Take 2 tablets (20 mg total) in the morning & 1 tablet (10 mg total) in the evening daily. 90 tablet 0  . levothyroxine (SYNTHROID, LEVOTHROID) 175 MCG tablet Take 175 mcg by mouth daily before breakfast.     No current facility-administered medications for this visit.   No family history on file. Social History   Social History  . Marital Status: Widowed    Spouse Name: N/A  . Number of Children: N/A  . Years of Education: N/A   Social History Main Topics  . Smoking status: Current Every Day Smoker  . Smokeless tobacco: None  . Alcohol Use: No  . Drug Use: None  . Sexual Activity: Not Asked   Other Topics Concern  . None   Social History Narrative   Review of Systems: Review of Systems  Constitutional: Positive for malaise/fatigue. Negative for fever, chills and weight loss.  Respiratory: Negative for cough, shortness of breath and wheezing.   Cardiovascular: Negative for chest pain, palpitations and leg swelling.  Gastrointestinal: Negative for heartburn, nausea, vomiting, abdominal pain, diarrhea and  constipation.       Positive for low appetite.  Musculoskeletal: Negative for myalgias and joint pain.  Neurological: Negative for dizziness, tingling, weakness and headaches.    Objective:  Physical Exam: Filed Vitals:   02/18/15 1618  BP: 156/75  Pulse: 96  Temp: 98.1 F (36.7 C)  TempSrc: Oral  Height:  (1.626 m)  Weight: 146 lb 9.6 oz (66.497 kg)  SpO2: 100%   Physical Exam  Constitutional: She is oriented to person, place, and time. She appears well-developed and well-nourished.  HENT:  Head: Normocephalic and atraumatic.  Mouth/Throat: Oropharynx is clear and moist.  Cardiovascular: Normal rate, regular rhythm and normal heart sounds.   Pulmonary/Chest: Effort normal and breath sounds normal.  Abdominal: Soft. There is no tenderness.  Musculoskeletal: Normal range of motion. She exhibits no edema or tenderness.  Neurological: She is alert and oriented to person, place, and time.  Skin: Skin is warm.  Tan complexion, numerous dark freckles on extremities.  Psychiatric: She has a normal mood and affect.    Assessment & Plan:  Please see problem based charting for assessment and plan.

## 2015-02-19 LAB — BASIC METABOLIC PANEL
BUN/Creatinine Ratio: 12 (ref 9–23)
BUN: 13 mg/dL (ref 6–24)
CALCIUM: 9.3 mg/dL (ref 8.7–10.2)
CO2: 27 mmol/L (ref 18–29)
CREATININE: 1.07 mg/dL — AB (ref 0.57–1.00)
Chloride: 100 mmol/L (ref 97–108)
GFR, EST AFRICAN AMERICAN: 66 mL/min/{1.73_m2} (ref >59)
GFR, EST NON AFRICAN AMERICAN: 57 mL/min/{1.73_m2} — AB (ref >59)
Glucose: 117 mg/dL — ABNORMAL HIGH (ref 65–99)
POTASSIUM: 3.2 mmol/L — AB (ref 3.5–5.2)
Sodium: 142 mmol/L (ref 134–144)

## 2015-02-19 LAB — TSH: TSH: 0.701 u[IU]/mL (ref 0.450–4.500)

## 2015-02-22 NOTE — Addendum Note (Signed)
Addended by: Erlinda Hong T on: 02/22/2015 09:22 AM   Modules accepted: Level of Service

## 2015-02-22 NOTE — Progress Notes (Signed)
Internal Medicine Clinic Attending  I saw and evaluated the patient.  I personally confirmed the key portions of the history and exam documented by Dr. Patel,Vishal and I reviewed pertinent patient test results.  The assessment, diagnosis, and plan were formulated together and I agree with the documentation in the resident's note.  

## 2015-02-25 ENCOUNTER — Telehealth: Payer: Self-pay | Admitting: Internal Medicine

## 2015-02-25 NOTE — Telephone Encounter (Signed)
[place], she wanted something to be called in to a pharm there, explained why the md  could not do that, advised her to visit the local urgent care, she is agreeable

## 2015-02-25 NOTE — Telephone Encounter (Signed)
Patient is asking for a RX for nausea. Uses pharmacy [facility] [phone]. Pt only seen once on 02/18/15 for a HFU.

## 2015-03-19 HISTORY — PX: CHOLECYSTECTOMY: SHX55

## 2015-06-19 DIAGNOSIS — K5190 Ulcerative colitis, unspecified, without complications: Secondary | ICD-10-CM

## 2015-06-19 HISTORY — DX: Ulcerative colitis, unspecified, without complications: K51.90

## 2015-07-19 LAB — HM COLONOSCOPY

## 2016-01-20 ENCOUNTER — Telehealth: Payer: Self-pay | Admitting: Family Medicine

## 2016-01-20 NOTE — Telephone Encounter (Signed)
Rcvd ED notes, H & P notes, Consult, labs, ekg from [facility]

## 2016-01-24 ENCOUNTER — Telehealth: Payer: Self-pay

## 2016-01-24 ENCOUNTER — Encounter: Payer: Self-pay | Admitting: Family Medicine

## 2016-01-24 NOTE — Telephone Encounter (Signed)
[facility] R[facility] pt- placed in your folder for review.

## 2016-01-25 LAB — HM DIABETES EYE EXAM

## 2016-01-26 ENCOUNTER — Ambulatory Visit (INDEPENDENT_AMBULATORY_CARE_PROVIDER_SITE_OTHER): Payer: BLUE CROSS/BLUE SHIELD | Admitting: Family Medicine

## 2016-01-26 ENCOUNTER — Encounter: Payer: Self-pay | Admitting: Family Medicine

## 2016-01-26 VITALS — BP 180/86 | HR 74 | Ht 64.0 in | Wt 158.0 lb

## 2016-01-26 DIAGNOSIS — E1169 Type 2 diabetes mellitus with other specified complication: Secondary | ICD-10-CM

## 2016-01-26 DIAGNOSIS — E1159 Type 2 diabetes mellitus with other circulatory complications: Secondary | ICD-10-CM

## 2016-01-26 DIAGNOSIS — E038 Other specified hypothyroidism: Secondary | ICD-10-CM | POA: Diagnosis not present

## 2016-01-26 DIAGNOSIS — I10 Essential (primary) hypertension: Secondary | ICD-10-CM | POA: Diagnosis not present

## 2016-01-26 DIAGNOSIS — K529 Noninfective gastroenteritis and colitis, unspecified: Secondary | ICD-10-CM

## 2016-01-26 DIAGNOSIS — E2740 Unspecified adrenocortical insufficiency: Secondary | ICD-10-CM

## 2016-01-26 DIAGNOSIS — I152 Hypertension secondary to endocrine disorders: Secondary | ICD-10-CM

## 2016-01-26 MED ORDER — GLIMEPIRIDE 4 MG PO TABS: 4.0000 mg | ORAL_TABLET | Freq: Every day | ORAL | 5 refills | Status: DC

## 2016-01-26 NOTE — Progress Notes (Signed)
   Subjective:    Patient ID: Courtney MayotteSheryl Bonilla, female    DOB: 12/25/1955, 60 y.o.   MRN: [id number]  HPI She [place] here [place]. She has a very complicated history including diabetes, hypothyroidism, probable adrenal insufficiency as well as a history of colitis. In October of last year she also had a cholecystectomy. She was apparently diagnosed with adrenal insufficiency in 2016. She apparently did try to taper off and his own but ran into adrenal related issues and was kept it the present dosing regimen. Presently she is on a total of 30 mg of prednisone and in spite of this has continued to have difficulty with diarrhea. She states she has had diarrhea having 4-5 loose stools per day for several years. She was placed on cholestyramine and Apriso however stopped this due to cost. She did have recent blood work in April of this year which did show normal renal functions. Also her A1c was 6.9.  Review of Systems     Objective:   Physical Exam Alert and in no distress otherwise not examined Her medical records were reviewed They  will be scanned into the system. I do not see evidence of culture for H. pylori but I do see that she was treated for this. She also was placed on ulcerative colitis medication but the path report is not present.       Assessment & Plan:  Hypertension associated with diabetes (HCC)  Type 2 diabetes mellitus with other specified complication, without long-term current use of insulin (HCC) - Plan: Ambulatory referral to Endocrinology, glimepiride (AMARYL) 4 MG tablet  Other specified hypothyroidism - Plan: Ambulatory referral to Endocrinology  Adrenal insufficiency (HCC) - Plan: Ambulatory referral to Endocrinology  Colitis - Plan: Ambulatory referral to Gastroenterology This is a very complicated case and I'm going to need endocrinology as well as gastroenterology to help with this. I will also get the path report for both colonoscopy  and endoscopy. I will schedule her to return here in several weeks for recheck on her blood pressure.

## 2016-01-27 ENCOUNTER — Encounter: Payer: Self-pay | Admitting: Family Medicine

## 2016-01-27 ENCOUNTER — Telehealth: Payer: Self-pay

## 2016-01-27 NOTE — Telephone Encounter (Signed)
LMTCB- pt needs f/u in 2 weeks for BP recheck.

## 2016-01-27 NOTE — Telephone Encounter (Signed)
Scheduled 02/13/16 @ 9:15AM

## 2016-01-30 ENCOUNTER — Encounter: Payer: Self-pay | Admitting: Gastroenterology

## 2016-02-01 ENCOUNTER — Encounter: Payer: Self-pay | Admitting: Internal Medicine

## 2016-02-13 ENCOUNTER — Ambulatory Visit: Payer: BLUE CROSS/BLUE SHIELD | Admitting: Family Medicine

## 2016-02-17 ENCOUNTER — Encounter: Payer: Self-pay | Admitting: Family Medicine

## 2016-02-17 ENCOUNTER — Ambulatory Visit (INDEPENDENT_AMBULATORY_CARE_PROVIDER_SITE_OTHER): Payer: BLUE CROSS/BLUE SHIELD | Admitting: Family Medicine

## 2016-02-17 VITALS — BP 190/80 | HR 80 | Ht 64.0 in | Wt 157.8 lb

## 2016-02-17 DIAGNOSIS — I10 Essential (primary) hypertension: Secondary | ICD-10-CM | POA: Diagnosis not present

## 2016-02-17 DIAGNOSIS — Z23 Encounter for immunization: Secondary | ICD-10-CM | POA: Diagnosis not present

## 2016-02-17 DIAGNOSIS — E1159 Type 2 diabetes mellitus with other circulatory complications: Secondary | ICD-10-CM

## 2016-02-17 DIAGNOSIS — E038 Other specified hypothyroidism: Secondary | ICD-10-CM | POA: Diagnosis not present

## 2016-02-17 DIAGNOSIS — I152 Hypertension secondary to endocrine disorders: Secondary | ICD-10-CM

## 2016-02-17 MED ORDER — LISINOPRIL-HYDROCHLOROTHIAZIDE 20-12.5 MG PO TABS: 1.0000 | ORAL_TABLET | Freq: Every day | ORAL | 3 refills | Status: DC

## 2016-02-17 MED ORDER — LEVOTHYROXINE SODIUM 125 MCG PO TABS: 125.0000 ug | ORAL_TABLET | Freq: Every day | ORAL | 2 refills | Status: DC

## 2016-02-17 MED ORDER — AMLODIPINE BESYLATE 10 MG PO TABS: 10.0000 mg | ORAL_TABLET | Freq: Every day | ORAL | 3 refills | Status: DC

## 2016-02-17 NOTE — Progress Notes (Signed)
   Subjective:    Patient ID: Courtney Bonilla, female    DOB: 04/20/1956, 60 y.o.   MRN: [id number]  HPI She is here for recheck on her blood pressure. She has been taking lisinopril regularly. This medication in the past had worked well for her. She is having no difficulty with this. She would like a refill on her Synthroid as well. She did have blood work done in the spring which was apparently normal.   Review of Systems     Objective:   Physical Exam Alert and in no distress otherwise not examined       Assessment & Plan:  Hypertension associated with diabetes (HCC) - Plan: lisinopril-hydrochlorothiazide (ZESTORETIC) 20-12.5 MG tablet, amLODipine (NORVASC) 10 MG tablet  Other specified hypothyroidism - Plan: levothyroxine (SYNTHROID, LEVOTHROID) 125 MCG tablet  Need for shingles vaccine - Plan: Varicella-zoster vaccine subcutaneous  Need for prophylactic vaccination and inoculation against influenza - Plan: Flu Vaccine QUAD 36+ mos IM  I will add HCTZ to her lisinopril and also give 10 mg of amlodipine. Discussed possible side effects especially with the amlodipine with swelling. Her immunizations were also updated and thyroid was renewed. She is scheduled to see GI and endocrine in the near future. All the information concerning her previous medical history should be in the record.

## 2016-02-29 ENCOUNTER — Encounter: Payer: Self-pay | Admitting: Family Medicine

## 2016-03-01 ENCOUNTER — Encounter: Payer: Self-pay | Admitting: Family Medicine

## 2016-03-02 ENCOUNTER — Encounter: Payer: Self-pay | Admitting: Family Medicine

## 2016-03-09 ENCOUNTER — Telehealth: Payer: Self-pay | Admitting: Family Medicine

## 2016-03-09 DIAGNOSIS — E2740 Unspecified adrenocortical insufficiency: Secondary | ICD-10-CM

## 2016-03-09 MED ORDER — HYDROCORTISONE 10 MG PO TABS: ORAL_TABLET | ORAL | 0 refills | Status: DC

## 2016-03-09 NOTE — Telephone Encounter (Signed)
Give her enough to cover until she can see an endo doc. I'm not comfortable with giving this long term

## 2016-03-09 NOTE — Telephone Encounter (Signed)
I looked back and last Cortef/Hydrocortisone pills were prescribed 2016.  Has she been taking this regularly?  When was last time she took it and what dose?  I don't see where she would be on this regularly in recent weeks.     What dose did she and Dr. Susann GivensLalonde discuss?

## 2016-03-09 NOTE — Telephone Encounter (Signed)
Left message for pt to call me back 

## 2016-03-09 NOTE — Telephone Encounter (Signed)
Sent med to pharmacy  

## 2016-03-09 NOTE — Telephone Encounter (Signed)
Pt is moving back [place] and endocrinology at her previous state prescribed hydrocortisone 10mg . 2 tablets in the morning and 1 in the afternoon. She was prescribed #100 for 30 days. If she gets sick she has to double up on it so that's why she get a few extras. She has been on this med for over a year now. Per Patient " Dr.Lalonde has told her that she can not just taking this medicine".

## 2016-03-09 NOTE — Telephone Encounter (Signed)
Pt called and stated that she was referred to a endocrinologist but will not have an appt until 03/16/2016. JCL had given her Hydrocortisone but that will run out on Monday. Pt has some concerns about being off until her appt on 09/29. She is requesting a refill until she can get in with specialist. Please advise pt. [facility] uses [facility] and [facility] [address] [address]. Sending to [place] and [place].

## 2016-03-13 ENCOUNTER — Encounter: Payer: Self-pay | Admitting: Family Medicine

## 2016-03-13 NOTE — Progress Notes (Signed)
Review of endo and colonoscopy show nonspecific inflammatory changes. Endoscopy showed no evidence of H. pylori.

## 2016-03-16 ENCOUNTER — Encounter: Payer: Self-pay | Admitting: Internal Medicine

## 2016-03-16 ENCOUNTER — Ambulatory Visit (INDEPENDENT_AMBULATORY_CARE_PROVIDER_SITE_OTHER): Payer: BLUE CROSS/BLUE SHIELD | Admitting: Internal Medicine

## 2016-03-16 ENCOUNTER — Telehealth: Payer: Self-pay | Admitting: Internal Medicine

## 2016-03-16 VITALS — BP 127/69 | HR 87 | Ht 64.5 in | Wt 155.0 lb

## 2016-03-16 DIAGNOSIS — E1165 Type 2 diabetes mellitus with hyperglycemia: Secondary | ICD-10-CM | POA: Diagnosis not present

## 2016-03-16 DIAGNOSIS — E2740 Unspecified adrenocortical insufficiency: Secondary | ICD-10-CM

## 2016-03-16 DIAGNOSIS — E039 Hypothyroidism, unspecified: Secondary | ICD-10-CM | POA: Diagnosis not present

## 2016-03-16 DIAGNOSIS — E1169 Type 2 diabetes mellitus with other specified complication: Secondary | ICD-10-CM

## 2016-03-16 LAB — TSH: TSH: 0.21 u[IU]/mL — ABNORMAL LOW (ref 0.35–4.50)

## 2016-03-16 LAB — T4, FREE: Free T4: 1.56 ng/dL (ref 0.60–1.60)

## 2016-03-16 LAB — POCT GLYCOSYLATED HEMOGLOBIN (HGB A1C): Hemoglobin A1C: 9.2

## 2016-03-16 MED ORDER — SITAGLIPTIN PHOSPHATE 100 MG PO TABS: 100.0000 mg | ORAL_TABLET | Freq: Every day | ORAL | 2 refills | Status: DC

## 2016-03-16 MED ORDER — HYDROCORTISONE 5 MG PO TABS: ORAL_TABLET | ORAL | 3 refills | Status: DC

## 2016-03-16 MED ORDER — LEVOTHYROXINE SODIUM 112 MCG PO TABS: 112.0000 ug | ORAL_TABLET | Freq: Every day | ORAL | 1 refills | Status: DC

## 2016-03-16 NOTE — Telephone Encounter (Signed)
[facility]... We can switch to Glipizide 5 mg instead.

## 2016-03-16 NOTE — Telephone Encounter (Signed)
Patient stated insurance would not cover for her medication glimepiride (AMARYL) 4 MG tablet she is at the pharmacy now.

## 2016-03-16 NOTE — Patient Instructions (Addendum)
Please try to decrease Hydrocortisone to 15 mg in am and 5 mg in pm for 1 month. If you feel well on this dose, then try to decrease the dose further, to 10 mg in am and 5 mg in pm, no later then 3 pm.  - You absolutely need to take this medication every day and not skip doses. - Please double the dose if you have a fever, for the duration of the fever. - If you cannot take anything by mouth (vomiting) or you have severe diarrhea so that you eliminate the hydrocortisone pills in your stool, please make sure that you get the hydrocortisone in the vein instead - go to the nearest emergency department/urgent care or you may go to your PCPs office  - Please try to get a MedAlert bracelet or pendant indicating: "Adrenal insufficiency".  Please stop at the lab.  Continue Levothyroxine 125 mcg daily.  Take the thyroid hormone every day, with water, at least 30 minutes before breakfast, separated by at least 4 hours from: - acid reflux medications - calcium - iron - multivitamins  Please continue Amaryl 4 mg in a.m. Please add Januvia 100 mg daily in a.m.  Please return in 1.5 months with your sugar log.   PATIENT INSTRUCTIONS FOR TYPE 2 DIABETES:  **Please join MyChart!** - see attached instructions about how to join if you have not done so already.  DIET AND EXERCISE Diet and exercise is an important part of diabetic treatment.  We recommended aerobic exercise in the form of brisk walking (working between 40-60% of maximal aerobic capacity, similar to brisk walking) for 150 minutes per week (such as 30 minutes five days per week) along with 3 times per week performing 'resistance' training (using various gauge rubber tubes with handles) 5-10 exercises involving the major muscle groups (upper body, lower body and core) performing 10-15 repetitions (or near fatigue) each exercise. Start at half the above goal but build slowly to reach the above goals. If limited by weight, joint pain, or  disability, we recommend daily walking in a swimming pool with water up to waist to reduce pressure from joints while allow for adequate exercise.    BLOOD GLUCOSES Monitoring your blood glucoses is important for continued management of your diabetes. Please check your blood glucoses 2-4 times a day: fasting, before meals and at bedtime (you can rotate these measurements - e.g. one day check before the 3 meals, the next day check before 2 of the meals and before bedtime, etc.).   HYPOGLYCEMIA (low blood sugar) Hypoglycemia is usually a reaction to not eating, exercising, or taking too much insulin/ other diabetes drugs.  Symptoms include tremors, sweating, hunger, confusion, headache, etc. Treat IMMEDIATELY with 15 grams of Carbs: . 4 glucose tablets .  cup regular juice/soda . 2 tablespoons raisins . 4 teaspoons sugar . 1 tablespoon honey Recheck blood glucose in 15 mins and repeat above if still symptomatic/blood glucose <100.  RECOMMENDATIONS TO REDUCE YOUR RISK OF DIABETIC COMPLICATIONS: * Take your prescribed MEDICATION(S) * Follow a DIABETIC diet: Complex carbs, fiber rich foods, (monounsaturated and polyunsaturated) fats * AVOID saturated/trans fats, high fat foods, >2,300 mg salt per day. * EXERCISE at least 5 times a week for 30 minutes or preferably daily.  * DO NOT SMOKE OR DRINK more than 1 drink a day. * Check your FEET every day. Do not wear tightfitting shoes. Contact us if you develop an ulcer * See your EYE doctor once a year or  more if needed * Get a FLU shot once a year * Get a PNEUMONIA vaccine once before and once after age 465 years  GOALS:  * Your Hemoglobin A1c of <7%  * fasting sugars need to be <130 * after meals sugars need to be <180 (2h after you start eating) * Your Systolic BP should be 140 or lower  * Your Diastolic BP should be 80 or lower  * Your HDL (Good Cholesterol) should be 40 or higher  * Your LDL (Bad Cholesterol) should be 100 or lower. *  Your Triglycerides should be 150 or lower  * Your Urine microalbumin (kidney function) should be <30 * Your Body Mass Index should be 25 or lower    Please consider the following ways to cut down carbs and fat and increase fiber and micronutrients in your diet: - substitute whole grain for white bread or pasta - substitute brown rice for white rice - substitute 90-calorie flat bread pieces for slices of bread when possible - substitute sweet potatoes or yams for white potatoes - substitute humus for margarine - substitute tofu for cheese when possible - substitute almond or rice milk for regular milk (would not drink soy milk daily due to concern for soy estrogen influence on breast cancer risk) - substitute dark chocolate for other sweets when possible - substitute water - can add lemon or orange slices for taste - for diet sodas (artificial sweeteners will trick your body that you can eat sweets without getting calories and will lead you to overeating and weight gain in the long run) - do not skip breakfast or other meals (this will slow down the metabolism and will result in more weight gain over time)  - can try smoothies made from fruit and almond/rice milk in am instead of regular breakfast - can also try old-fashioned (not instant) oatmeal made with almond/rice milk in am - order the dressing on the side when eating salad at a restaurant (pour less than half of the dressing on the salad) - eat as little meat as possible - can try juicing, but should not forget that juicing will get rid of the fiber, so would alternate with eating raw veg./fruits or drinking smoothies - use as little oil as possible, even when using olive oil - can dress a salad with a mix of balsamic vinegar and lemon juice, for e.g. - use agave nectar, stevia sugar, or regular sugar rather than artificial sweateners - steam or broil/roast veggies  - snack on veggies/fruit/nuts (unsalted, preferably) when possible,  rather than processed foods - reduce or eliminate aspartame in diet (it is in diet sodas, chewing gum, etc) Read the labels!  Try to read Dr. Katherina RightNeal Barnard's book: "Program for Reversing Diabetes" for other ideas for healthy eating.

## 2016-03-16 NOTE — Progress Notes (Signed)
Patient ID: Courtney Bonilla, female   DOB: 1955/11/27, 60 y.o.   MRN: [id number]   HPI: Courtney Bonilla is a 60 y.o.-year-old female, referred by her PCP, Dr. Redmond School, for management of DM2, dx in 2014, non-insulin-dependent, uncontrolled, without complications, also hypothyroidism, Adrenal insufficiency, dx 01/2015. She saw endocrinologist in [place]: Dr. Joan Mayans  Reviewed Hemoglobin A1c levels 03/16/2016: HbA1c 9.2% (checked today in clinic) 09/29/2015: HbA1c 6.9%  Pt is on a regimen of: - Amaryl 4 mg in am  Pt checks her sugars 1x a week: - am: 82, 111-152  - 2h after b'fast: n/c - before lunch: n/c - 2h after lunch: n/c - before dinner: n/c - 2h after dinner: n/c - bedtime: n/c - nighttime: n/c No lows. Lowest sugar was 87; ? hypoglycemia awareness Highest sugar was 260.  Glucometer: ReliOn  Pt's meals are: - Breakfast: usually skips, but sometimes egg + toast - Lunch: sandwich - Dinner: meat + veggies - Snacks: 2: cheese + crackers, PB + crackers, candy bars after dinner, potato chips  She lost 40 lbs before AI dx. Now gained back 20 lbs.   - no CKD, last BUN/creatinine:  09/29/2015: 15/1.77, EGFR 51, ACR 84.6 Lab Results  Component Value Date   BUN 13 02/18/2015   BUN 16 02/09/2015   CREATININE 1.07 (H) 02/18/2015   CREATININE 1.24 (H) 02/09/2015  She is on lisinopril 20 mg daily. - last set of lipids: 09/29/2015: 274/534/32/134 No results found for: CHOL, HDL, LDLCALC, LDLDIRECT, TRIG, CHOLHDL  She is not on a statin. - last eye exam was in 01/2015. No DR.  - no numbness and tingling in her feet. She has PVD >> has mm pain (claudication) in L leg - has a h/o stent.  Pt has FH of DM in M, F, brother, sister.  Hypothyroidism:  She is on LT4 125, now, decreased in 09/2015 from 150 g daily. The higher dose was started in the hospital, however, she has been on 125 g daily for a long time, with normal thyroid tests: before that.  She is taking the  levothyroxine: - Every day - Fasting - Usually skips breakfast or eats it more than 30 minutes later - No calcium, iron, multivitamins, PPIs  Reviewed labs: - 09/29/2015: TSH 0.011 - on 150 mcg Lab Results  Component Value Date   TSH 0.701 02/18/2015   Adrenal Insufficiency:  She has a h/o Prednisone tapers x 2 before back sx in 11/2015. No steroid inj, but has had steroid packs 2x a year.   Of note, adrenal glands appeared normal on a CT abd. 02/2016.  Reviewed available labs: 02/08/2015 (at dx): cortisol (10:25 am): 1.8, ACTH 19.7 02/25/2015:  21 hydroxylase antibodies <1.0, anti-adrenal antibodies: negative, cortisol 20.6, ACTH 5.6, aldosterone <1.0, PRA <0.15, Sodium 141 (135-146), TSH 1.46  04/07/2015 (patient believes these labs were obtained on 5 mg of hydrocortisone daily only): ACTH 4.2, aldosterone <1.0, PRA 0.18  She is on HC 20 mg in am and 10 in pm >> previous endocrinologist tried to taper this down >> hospitalized when decreased to 10 mg daily. She has a "spell" every 2 weeks >> flu-like illness, loss of appetite, generalized mm pain >> doubles the HC dose for 2-3 days >> resolves.   Craves salt.  BP not low. Actually dx'ed with HTN >> started on Amlodipine this month.  She has a dx of UC in 06/2015 >> most times has postprandial diarrhea.   ROS: Constitutional: + Both: weight gain/loss, + fatigue,  no subjective hyperthermia/hypothermia, + poor sleep Eyes: no blurry vision, no xerophthalmia ENT: no sore throat, no nodules palpated in throat, no dysphagia/odynophagia, no hoarseness Cardiovascular: no CP/SOB/palpitations/leg swelling Respiratory: no cough/SOB Gastrointestinal: + N/+ V/+ D/no C Musculoskeletal: + both: muscle/joint aches Skin: no rashes Neurological: no tremors/numbness/tingling/dizziness Psychiatric: no depression/anxiety  Past Medical History:  Diagnosis Date  . Back pain   . Diabetes mellitus without complication ([place])   . Hyperlipidemia    . Hypertension   . Thyroid disease    Past Surgical History:  Procedure Laterality Date  . BACK SURGERY  November 26 2014   Dr Sherley Bounds  . CHOLECYSTECTOMY  03/2015   Social History   Social History  . Marital status: Widowed    Spouse name: N/A  . Number of children: 1   Occupational History  . retired   Social History Main Topics  . Smoking status: Current Every Day Smoker, 1 pack a day   . Smokeless tobacco: Not on file  . Alcohol use No  . Drug use: No    Current Outpatient Prescriptions on File Prior to Visit  Medication Sig Dispense Refill  . amLODipine (NORVASC) 10 MG tablet Take 1 tablet (10 mg total) by mouth daily. 90 tablet 3  . glimepiride (AMARYL) 4 MG tablet Take 1 tablet (4 mg total) by mouth daily with breakfast. 30 tablet 5  . hydrocortisone (CORTEF) 10 MG tablet Take 2 tablets (20 mg total) in the morning & 1 tablet (10 mg total) in the afternoon daily. 100 tablet 0  . levothyroxine (SYNTHROID, LEVOTHROID) 125 MCG tablet Take 1 tablet (125 mcg total) by mouth daily before breakfast. 90 tablet 2  . lisinopril-hydrochlorothiazide (ZESTORETIC) 20-12.5 MG tablet Take 1 tablet by mouth daily. 90 tablet 3   No current facility-administered medications on file prior to visit.    No Known Allergies  Family history: See history of present illness + - Thyroid disease in mother and brother.  PE: BP 127/69   Pulse 87   Ht 5' 4.5" (1.638 m)   Wt 155 lb (70.3 kg)   LMP  (LMP Unknown)   BMI 26.19 kg/m  Wt Readings from Last 3 Encounters:  03/16/16 155 lb (70.3 kg)  02/17/16 157 lb 12.8 oz (71.6 kg)  01/26/16 158 lb (71.7 kg)   Constitutional: Slightly overweight, in NAD, extremely tanned, however, lighter complexion in areas covered by her bathing suite Eyes: PERRLA, EOMI, no exophthalmos ENT: moist mucous membranes, no thyromegaly, no cervical lymphadenopathy Cardiovascular: RRR, No MRG Respiratory: CTA B Gastrointestinal: abdomen soft, NT, ND,  BS+ Musculoskeletal: no deformities, strength intact in all 4 Skin: moist, warm, no rashes; dark skin; oral mucosal dark discoloration Neurological: no tremor with outstretched hands, DTR normal in all 4  ASSESSMENT: 1. DM2, non-insulin-dependent, uncontrolled, without long term complications, but with hyperglycemia  2. Hypothyroidism  3. Adrenal insufficiency  PLAN:  1. Patient with three-year history of relatively well controlled diabetes, on oral antidiabetic regimen, which became insufficient.Her HbA1c today is much higher than before, at 9.2%. She is surprised about this, since previously her HbA1c's have been around 7 or even lower. She has not changed her diabetes medication, diet, or exercise since last check 4 months ago, when her HbA1c was 6.9%. However, she is not checking sugars consistently, and I advised her to start checking at least once every day. - I believe that we need to check her for type 1 diabetes, however, her sugars today are above 240, therefore,  we cannot check her insulin production. I will, however, check her GAD and ICA pancreatic antibodies. - For now, I advised her to continue the Amaryl but to move it before the first meal of the day to avoid hypoglycemia as she takes it first thing in the morning without eating. I also advised her to add Januvia. She may need insulin, however, we need to check her insulin production first. - Strongly advised her to start checking sugars at different times of the day - check once a day, rotating checks - given sugar log and advised how to fill it and to bring it at next appt  - given foot care handout and explained the principles  - given instructions for hypoglycemia management "15-15 rule"  - advised for yearly eye exams >> She she is due  - Return to clinic in 1.5 mo with sugar log   2. Hypothyroidism - She is taking the levothyroxine correctly -  Her dose has been adjusted after the last TSH, which returned suppressed   - We'll continue her 125 g levothyroxine daily  - We'll recheck her TFTs today    3. Adrenal insufficiency - Unclear etiology - She had negative anti-pancreatic antibodies, which would make Addison's disease less likely. However, there are other possible causes for her adrenal insufficiency - one of them being her previous steroid use. 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 She has, however increased skin and mucosa pigmentation, which points towards primary adrenal insufficiency. In conclusion, I cannot make a definitive diagnosis based on the available data, but will continue to treat her as adrenal insufficiency and will maintain her on hydrocortisone. - we also discussed about proper replacement with Hydrocortisone >>  I explained side effects of overreplacement on many organs in the body and the fact that we need to decrease the dose to the minimum dose that allows her to feel well - a bid dose is likely best >> 10 mg in am and 5 mg in pm, best before 3 pm. She is willing to start decreasing the dose to avoid complications, we'll start by decreasing hydrocortisone to 50 mg in a.m. and 5 mg in p.m. for next month. - given sick days rules - advised for Flora bracelet mentioning "adrenal insufficiency". - She craves salt, however, She has had problems with hypertension, therefore, I would be reticent to start fludrocortisone. We'll try to decrease the hydrocortisone dose to a target of 10 mg in a.m. and 5 mg in p.m. and recheck aldosterone and also check orthostatics at next visit.  I advised the patient to:   Patient Instructions  Please try to decrease Hydrocortisone to 15 mg in am and 5 mg in pm for 1 month. If you feel well on this dose, then try to decrease the dose further, to 10 mg in am and 5 mg in pm, no later then 3 pm.  - You absolutely need  to take this medication every day and not skip doses. - Please double the dose if you have a fever, for the duration of the fever. - If you cannot take anything by mouth (vomiting) or you have severe diarrhea so that you eliminate the hydrocortisone pills in your stool, please make sure that you get the hydrocortisone in the vein instead - go to the nearest emergency department/urgent care or you may go to your  PCPs office  - Please try to get a MedAlert bracelet or pendant indicating: "Adrenal insufficiency".  Please stop at the lab.  Continue Levothyroxine 125 mcg daily.  Take the thyroid hormone every day, with water, at least 30 minutes before breakfast, separated by at least 4 hours from: - acid reflux medications - calcium - iron - multivitamins  Please continue Amaryl 4 mg in a.m. Please add Januvia 100 mg daily in a.m.  Please return in 1.5 months with your sugar log.   Orders Placed This Encounter  Procedures  . T4, free  . TSH  . Glutamic acid decarboxylase auto abs  . Anti-islet cell antibody  . POCT HgB A1C   - time spent with the patient: 1 hour, of which >50% was spent in obtaining information about her symptoms, reviewing her previous labs, evaluations, and treatments, counseling her about her 3 endocrine conditions (please see the discussed topics above), and developing a plan to further investigate and treat them; she had a number of questions which I addressed.  Component     Latest Ref Rng & Units 03/16/2016  TSH     0.35 - 4.50 uIU/mL 0.21 (L)  T4,Free(Direct)     0.60 - 1.60 ng/dL 1.56  TSH is suppressed, therefore, will go ahead and decrease the dose of levothyroxine from 125 g to 112 g daily. We will recheck her tests when she comes back in a month and a half.  Pancreatic antibodies are still pending. I will addend the results when they become available.  Philemon Kingdom, MD PhD [facility] Endocrinology

## 2016-03-16 NOTE — Telephone Encounter (Signed)
See message and please advise, Thanks!  

## 2016-03-19 ENCOUNTER — Other Ambulatory Visit: Payer: Self-pay

## 2016-03-19 ENCOUNTER — Telehealth: Payer: Self-pay

## 2016-03-19 LAB — GLUTAMIC ACID DECARBOXYLASE AUTO ABS

## 2016-03-19 MED ORDER — LINAGLIPTIN 5 MG PO TABS: 5.0000 mg | ORAL_TABLET | Freq: Every day | ORAL | 2 refills | Status: DC

## 2016-03-19 MED ORDER — GLIPIZIDE 5 MG PO TABS: 5.0000 mg | ORAL_TABLET | Freq: Every day | ORAL | 2 refills | Status: DC

## 2016-03-19 NOTE — Telephone Encounter (Signed)
Let's try Tradjenta 5 mg 

## 2016-03-19 NOTE — Telephone Encounter (Signed)
Sent in Glipizide as patient stated Amaryl was no longer covered by insurance.

## 2016-03-19 NOTE — Telephone Encounter (Signed)
Patient called and states that she went to pick up the Januvia and it was not covered by insurance. Would you like to send in PA or try something else?

## 2016-03-22 LAB — ANTI-ISLET CELL ANTIBODY

## 2016-04-06 ENCOUNTER — Ambulatory Visit (INDEPENDENT_AMBULATORY_CARE_PROVIDER_SITE_OTHER): Payer: BLUE CROSS/BLUE SHIELD | Admitting: Gastroenterology

## 2016-04-06 ENCOUNTER — Encounter: Payer: Self-pay | Admitting: Gastroenterology

## 2016-04-06 ENCOUNTER — Other Ambulatory Visit (INDEPENDENT_AMBULATORY_CARE_PROVIDER_SITE_OTHER): Payer: BLUE CROSS/BLUE SHIELD

## 2016-04-06 VITALS — BP 108/56 | HR 84 | Ht 64.0 in | Wt 156.0 lb

## 2016-04-06 DIAGNOSIS — K529 Noninfective gastroenteritis and colitis, unspecified: Secondary | ICD-10-CM

## 2016-04-06 DIAGNOSIS — K51919 Ulcerative colitis, unspecified with unspecified complications: Secondary | ICD-10-CM | POA: Diagnosis not present

## 2016-04-06 LAB — IGA: IGA: 197 mg/dL (ref 68–378)

## 2016-04-06 NOTE — Patient Instructions (Addendum)
We will get records sent from your previous gastroenterologist for review.  This will include any endoscopic (colonoscopy or upper endoscopy) procedures and any associated pathology reports.  Need colonoscopy and pathology reports to better understand your Ulcerative Colitis diagnosis. You will have labs checked today in the basement lab.  Please head down after you check out with the front desk  (ttg, total IgA level, stool pathogen panel).  You may need repeat colonoscopy, pending review of the  Start one imodium twice daily with meals.

## 2016-04-06 NOTE — Progress Notes (Signed)
HPI: This is a   very pleasant 61 year old woman    who was referred to me by Ronnald Nian, MD  to evaluate  history of ulcerative colitis .    Chief complaint is ulcerative colitis   EGD Dr. Allena Katz : done for abd pain, chronic GERD, chronic nausea:  "Mild distal esophagitis, grade 1, with slightly irregular Z line. Small hiatal hernia. Mild gastritis. Mild duodenitis. Biopsies taken". He recommended she increase her omeprazole 40 mg, add 300 mg of Zantac twice daily. He recommended repeat EGD in 2 years to "reassess the esophagus"  I cannot find records of her previous colonoscopy or any associated pathology reports.  GB removed for abd pain, biliary dyskenesia 03/2015  Dr. Allena Katz  August 2016: diagnosed with addisons disease while in [place].  Her endocrinologist she established with in [facility] wasn't so sure. She was having vomiting, diarrhea and eventtually saw Dr. Allena Katz from GI: Eventually underwent lap chole.  Symptoms persisted.  Nausea and diarrhea. Finally had colonoscopy. She was told she had UC and was put on apriso 4 pills, then weaned this over 2-3 months to zero because it wasn't helping.  She has loose stools daily; very rarely has solid stools.  Always post prandial.  No overt Gi bleeding.    Had hemorrhoidal stapling 2015, surgery as well.  Overall her weight has been stable.  No IBD in her family.  Smokes 1ppd many mnay years.  The chronic diarrhea has been going on for 1-2 years.  Very rarely   Back surgery 2016, prior to this bowels were normal.  Afterwards n/diarrhe loss of 40 pounds.  New endocrinologist here in [place], on hydrocortisone daily.  She is weaning this.  Review of systems: Pertinent positive and negative review of systems were noted in the above HPI section. Complete review of systems was performed and was otherwise normal.   Past Medical History:  Diagnosis Date  . Addison disease (HCC) 2016  . Back pain   . Diabetes mellitus without complication  (HCC)   . Hyperlipidemia   . Hypertension   . Thyroid disease   . UC (ulcerative colitis) (HCC) 2017    Past Surgical History:  Procedure Laterality Date  . BACK SURGERY  November 26 2014   Dr Marikay Alar  . CHOLECYSTECTOMY  03/2015  . HEMORROIDECTOMY  2015  . STENT PLACEMENT ILIAC ([facility])  2014  . TOTAL ABDOMINAL HYSTERECTOMY  1981    Current Outpatient Prescriptions  Medication Sig Dispense Refill  . amLODipine (NORVASC) 10 MG tablet Take 1 tablet (10 mg total) by mouth daily. 90 tablet 3  . glimepiride (AMARYL) 4 MG tablet Take 4 mg by mouth daily with breakfast.    . glipiZIDE (GLUCOTROL) 5 MG tablet Take 1 tablet (5 mg total) by mouth daily before breakfast. 60 tablet 2  . hydrocortisone (CORTEF) 5 MG tablet Take 3 tablets in the morning & 1 tablet in the afternoon daily. 360 tablet 3  . levothyroxine (SYNTHROID, LEVOTHROID) 112 MCG tablet Take 1 tablet (112 mcg total) by mouth daily. 60 tablet 1  . lisinopril-hydrochlorothiazide (ZESTORETIC) 20-12.5 MG tablet Take 1 tablet by mouth daily. 90 tablet 3   No current facility-administered medications for this visit.     Allergies as of 04/06/2016  . (No Known Allergies)    Family History  Problem Relation Age of Onset  . Diabetes Mother   . Hypertension Mother   . Thyroid disease Mother   . Hypertension Father   .  Diabetes Sister   . Hypertension Sister   . Thyroid disease Sister   . Diabetes Brother   . Hypertension Brother   . Thyroid disease Brother     Social History   Social History  . Marital status: Widowed    Spouse name: N/A  . Number of children: 1  . Years of education: N/A   Occupational History  . retired    Social History Main Topics  . Smoking status: Current Every Day Smoker  . Smokeless tobacco: Never Used  . Alcohol use No  . Drug use: No  . Sexual activity: Not on file   Other Topics Concern  . Not on file   Social History Narrative  . No narrative on file     Physical  Exam: BP (!) 108/56 (BP Location: Left Arm, Patient Position: Sitting, Cuff Size: Normal)   Pulse 84   Ht 5\' 4"  (1.626 m) Comment: measured without shoes  Wt 156 lb (70.8 kg)   LMP  (LMP Unknown)   BMI 26.78 kg/m  Constitutional: generally well-appearing Psychiatric: alert and oriented x3 Eyes: extraocular movements intact Mouth: oral pharynx moist, no lesions Neck: supple no lymphadenopathy Cardiovascular: heart regular rate and rhythm Lungs: clear to auscultation bilaterally Abdomen: soft, nontender, nondistended, no obvious ascites, no peritoneal signs, normal bowel sounds Extremities: no lower extremity edema bilaterally Skin: no lesions on visible extremities   Assessment and plan: 60 y.o. female with  chronic diarrhea, postprandial, she was told she had ulcerative colitis last year  I cannot find records in our system for her previous colonoscopy or any associated pathology reports. We will send away for these from her previous gastroenterologist. I'm also not really convinced that she has ulcerative colitis but I certainly need to review those previous records. She might require repeat colonoscopy pending that review. She will get a basic set of labs testing for celiac sprue and also infectious workup with GI pathogen panel today. She has never even tried Imodium for her chronic loose stools. I recommended she start taking one Imodium twice daily with meals while we await lab testing and review of her records.   Rob Buntinganiel Lakya Schrupp, MD [facility] 04/06/2016, 9:55 AM  Cc: Ronnald NianLalonde, John C, MD

## 2016-04-09 LAB — TISSUE TRANSGLUTAMINASE, IGA: Tissue Transglutaminase Ab, IgA: 1 U/mL (ref <4)

## 2016-04-10 ENCOUNTER — Telehealth: Payer: Self-pay | Admitting: Gastroenterology

## 2016-04-10 NOTE — Telephone Encounter (Signed)
Pt has been notified that she can turn [facility]

## 2016-04-20 ENCOUNTER — Other Ambulatory Visit: Payer: Self-pay

## 2016-04-20 MED ORDER — SAXAGLIPTIN HCL 5 MG PO TABS: ORAL_TABLET | ORAL | 2 refills | Status: DC

## 2016-04-30 ENCOUNTER — Encounter: Payer: Self-pay | Admitting: Medical

## 2016-04-30 ENCOUNTER — Ambulatory Visit (INDEPENDENT_AMBULATORY_CARE_PROVIDER_SITE_OTHER): Payer: BLUE CROSS/BLUE SHIELD | Admitting: Medical

## 2016-04-30 ENCOUNTER — Telehealth: Payer: Self-pay

## 2016-04-30 VITALS — BP 130/70 | HR 82 | Wt 159.0 lb

## 2016-04-30 DIAGNOSIS — G8929 Other chronic pain: Secondary | ICD-10-CM

## 2016-04-30 DIAGNOSIS — F17200 Nicotine dependence, unspecified, uncomplicated: Secondary | ICD-10-CM

## 2016-04-30 DIAGNOSIS — R0989 Other specified symptoms and signs involving the circulatory and respiratory systems: Secondary | ICD-10-CM

## 2016-04-30 DIAGNOSIS — M79606 Pain in leg, unspecified: Secondary | ICD-10-CM | POA: Diagnosis not present

## 2016-04-30 DIAGNOSIS — M25512 Pain in left shoulder: Secondary | ICD-10-CM | POA: Diagnosis not present

## 2016-04-30 DIAGNOSIS — I739 Peripheral vascular disease, unspecified: Secondary | ICD-10-CM

## 2016-04-30 DIAGNOSIS — R591 Generalized enlarged lymph nodes: Secondary | ICD-10-CM

## 2016-04-30 IMAGING — US US RENAL
1 series · 14 of 25 positions shown · non-contrast
Comparison: None.

CLINICAL DATA: Acute renal failure.  Diabetes.

EXAM:
RENAL / URINARY TRACT ULTRASOUND COMPLETE

[Series 1: us renal · 0.21mm/px · 14 of 45 slices shown]
[im 1/45]
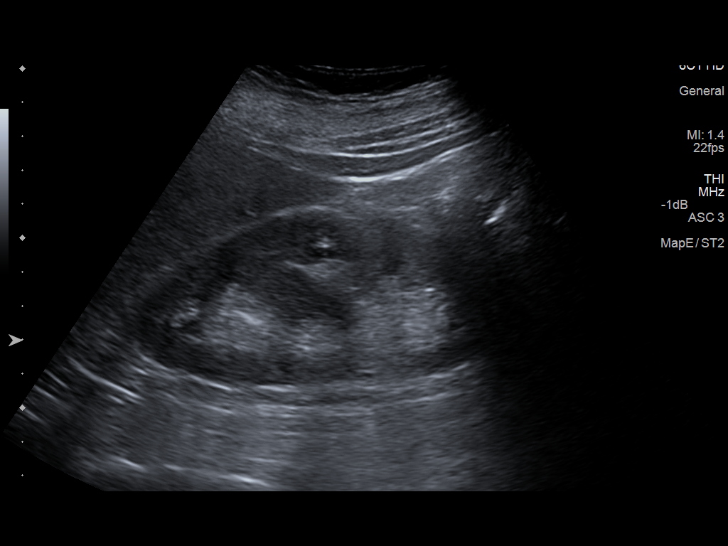
[im 4/45]
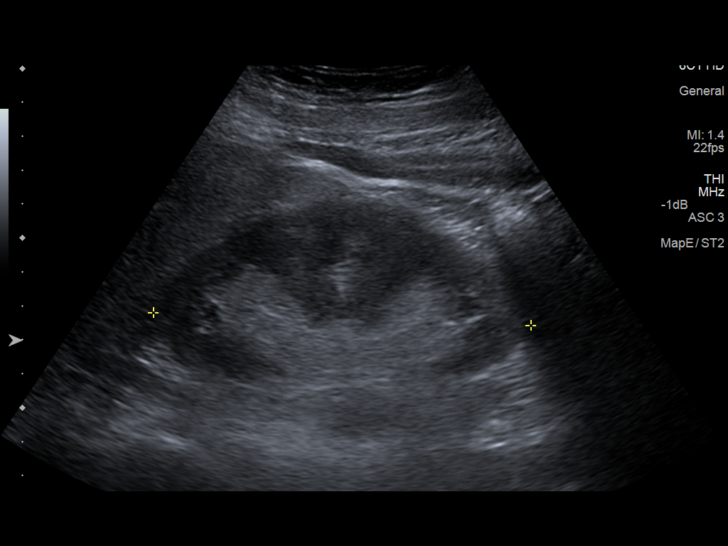
[im 8/45]
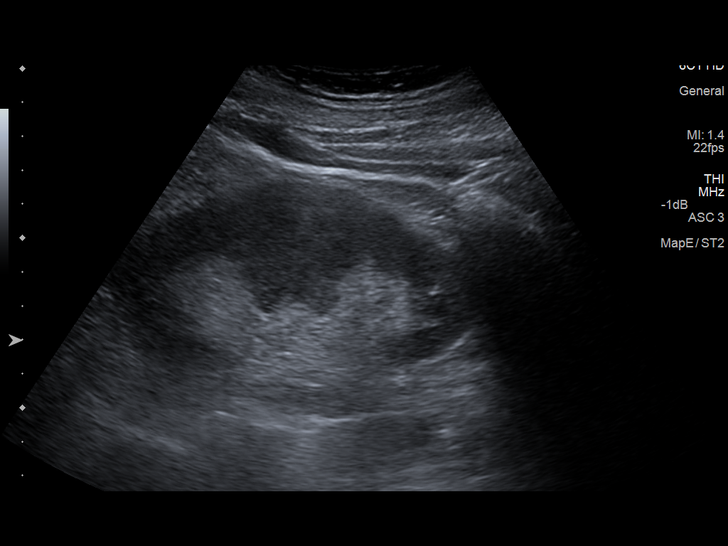
[im 12/45]
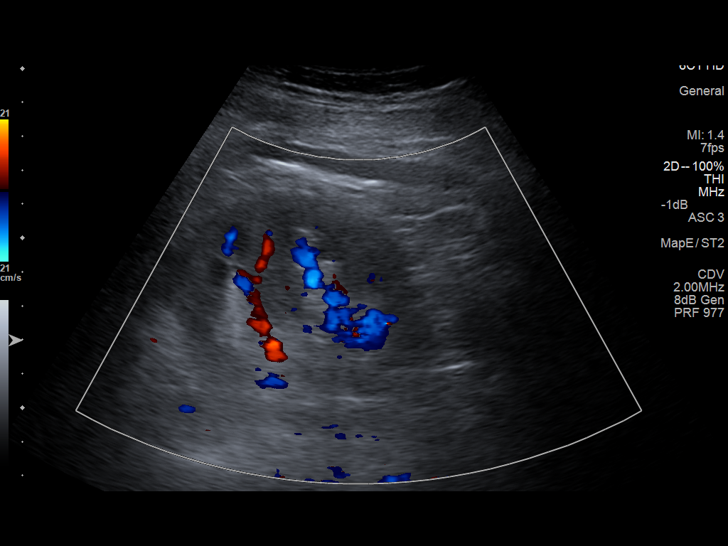
[im 15/45]
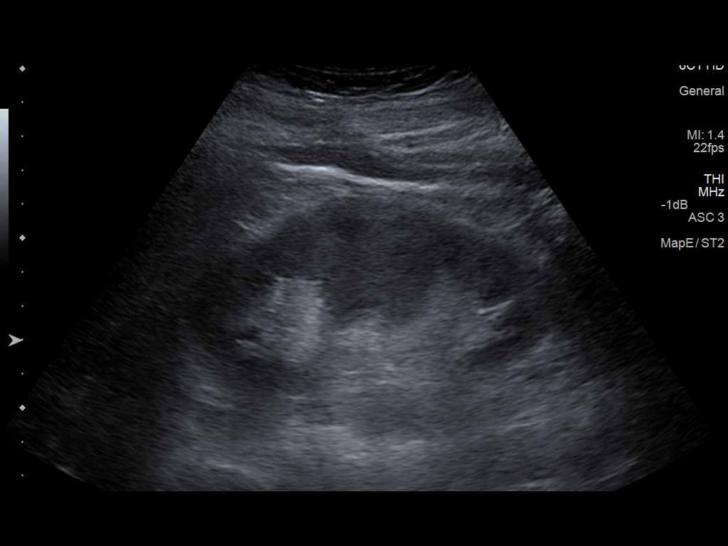
[im 17/45]
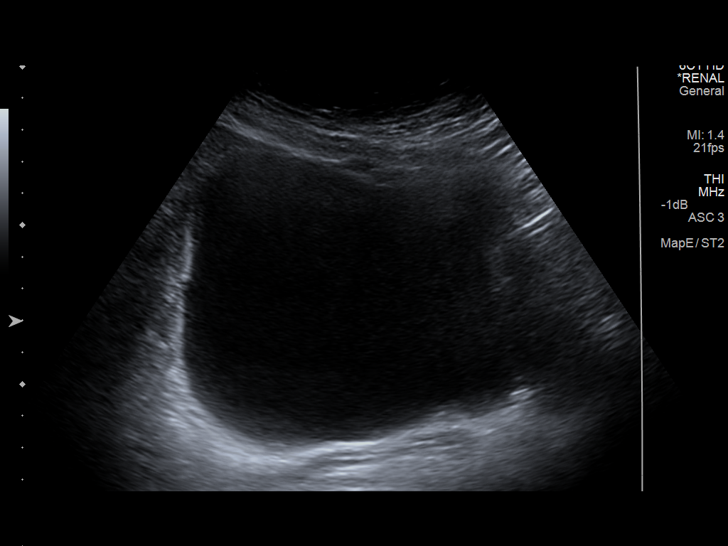
[im 21/45]
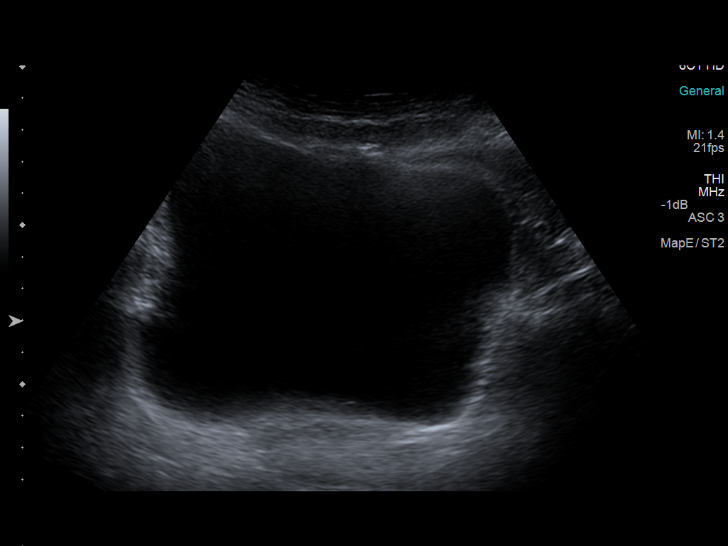
[im 24/45]
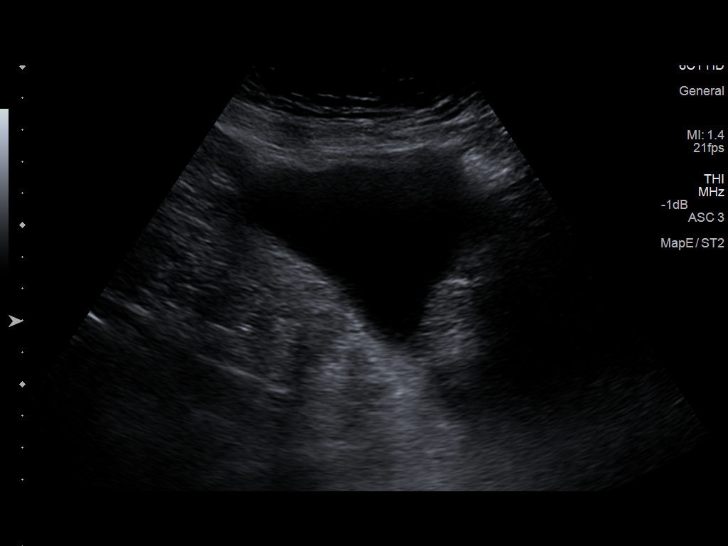
[im 28/45]
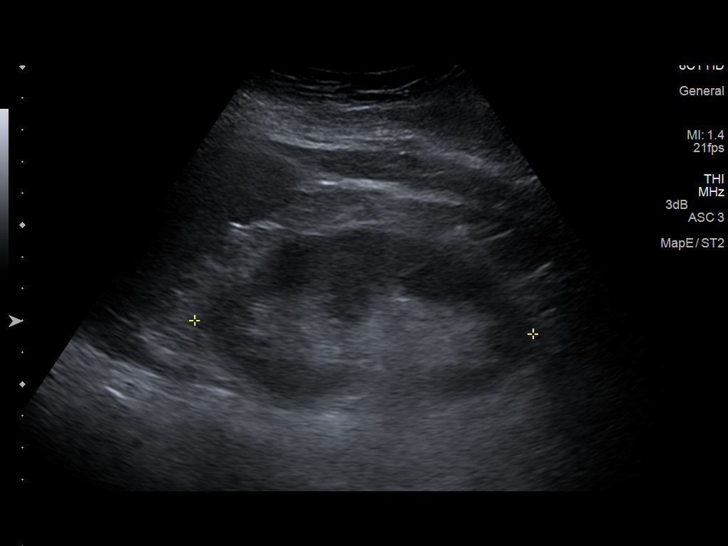
[im 30/45]
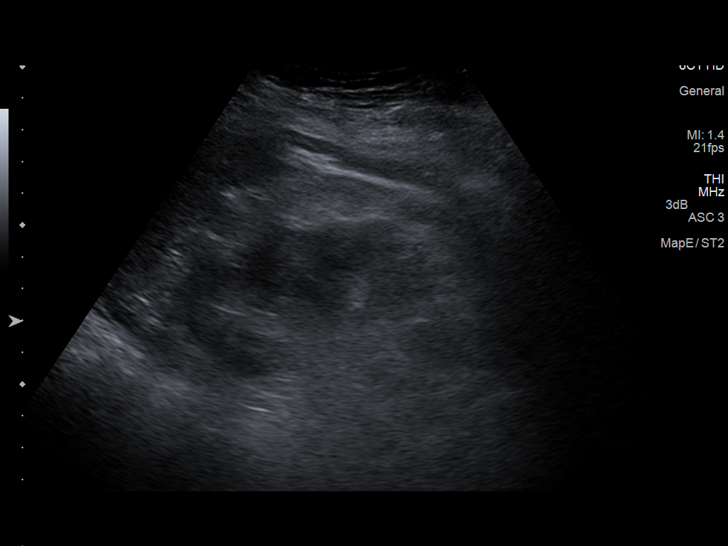
[im 34/45]
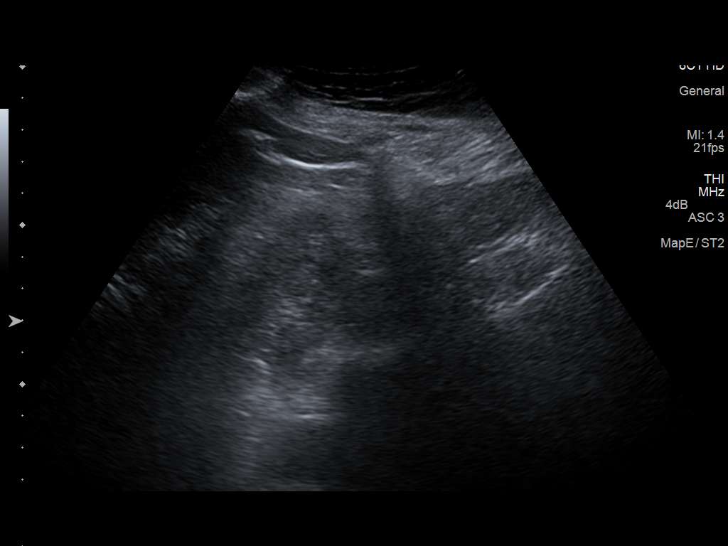
[im 37/45]
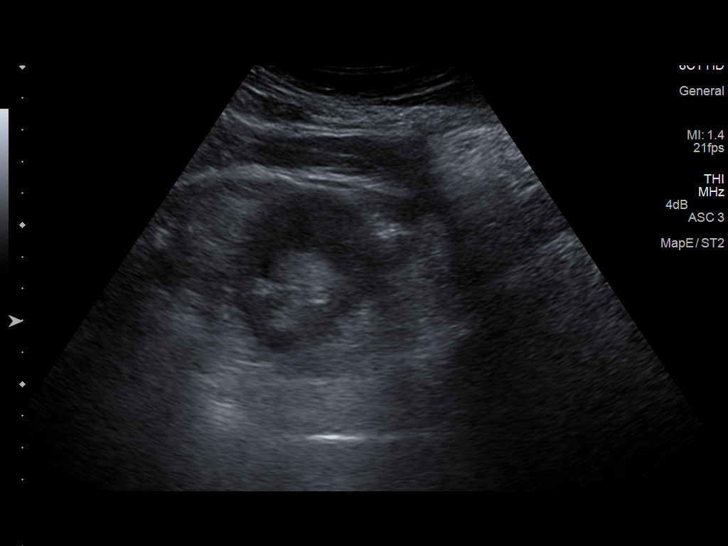
[im 41/45]
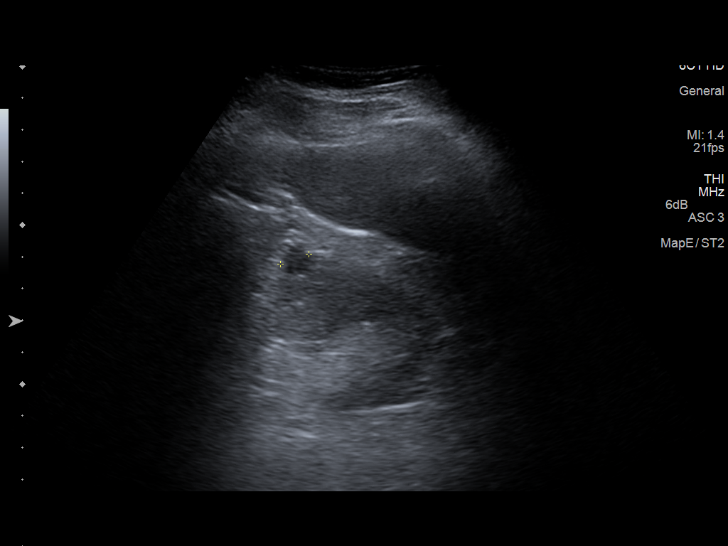
[im 45/45]
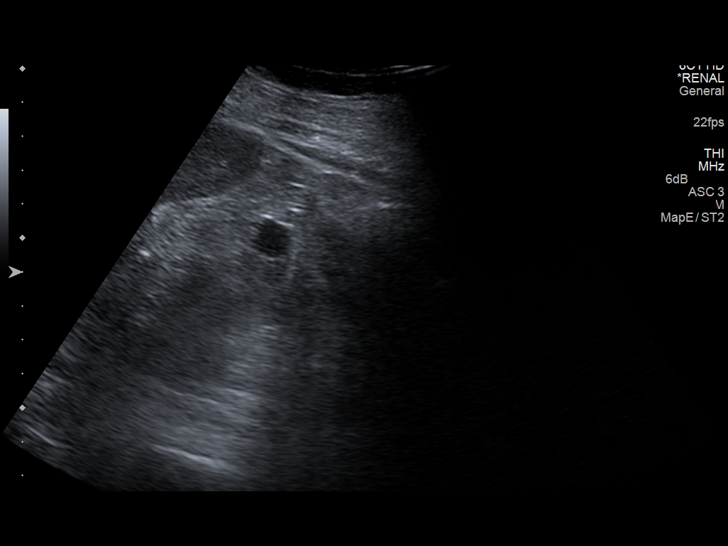

[14 of 25 positions shown; findings below may reference images not displayed]

FINDINGS: Right Kidney:

Length: 11.1 cm. Echogenicity within normal limits. No mass or
hydronephrosis visualized.

Left Kidney:

Length: 10.7 cm. Echogenicity within normal limits. No mass or
hydronephrosis visualized. 1.2 cm cyst.

Bladder:

Appears normal for degree of bladder distention. Bilateral ureteral
jets visualized.
IMPRESSION: No evidence of hydronephrosis.

1.2 cm left renal cyst.

## 2016-04-30 MED ORDER — HYDROCODONE-ACETAMINOPHEN 7.5-325 MG PO TABS: ORAL_TABLET | ORAL | 0 refills | Status: DC

## 2016-04-30 NOTE — Telephone Encounter (Signed)
Pt is set up for  Doppler on 04/30/2016 @ 3pm at [place] and x-ray to bone at that time to.

## 2016-04-30 NOTE — Progress Notes (Signed)
Subjective: Chief Complaint  Patient presents with  . lt shoulder pain     x2 weeks lt shoulder pain , leg pain    Here for several concerns.   She is a relatively new patient here at this office.  Here for left shoulder pain, attributed to arthritis.  Been having pain for a few months, worse in last 2 weeks.  No injury, no trauma, no fall.  No swelling, bruising, rash.  Pain awakes her from sleep.  Hurts all the time.  Has tried some ice, muscle rub OTC.  Denies numbness, tingling, weakness in arms.  No fever.  No prior xray.  She is a smoker, 40 pack year history, smokes 1ppd currently  She notes lately having bilat calve pain.  No swelling but both calves hurt, worse left.  She has hx/o PVD, prior stenting left femoral artery she thinks.  She also has hx/o heart cath few years ago but didn't require stents.  No hx/o DVT.  No recent long travel, surgery, or trauma.  She is worried about blockages as her legs now feel like they did few years ago before she had stents.   Gets pains in lower legs with walking relieved with rest.   Can't walk around [employer] to shop without pain. Had [place] leg (femoral?) [place]    Is right handed  She notes diagnosis of Addison's disease, has endocrinology f/u soon.   Past Medical History:  Diagnosis Date  . Addison disease (HCC) 2016  . Back pain   . Diabetes mellitus without complication (HCC)   . Hyperlipidemia   . Hypertension   . Thyroid disease   . UC (ulcerative colitis) (HCC) 2017   Current Outpatient Prescriptions on File Prior to Visit  Medication Sig Dispense Refill  . glimepiride (AMARYL) 4 MG tablet Take 4 mg by mouth daily with breakfast.    . glipiZIDE (GLUCOTROL) 5 MG tablet Take 1 tablet (5 mg total) by mouth daily before breakfast. 60 tablet 2  . hydrocortisone (CORTEF) 5 MG tablet Take 3 tablets in the morning & 1 tablet in the afternoon daily. 360 tablet 3  . levothyroxine (SYNTHROID, LEVOTHROID) 112 MCG  tablet Take 1 tablet (112 mcg total) by mouth daily. 60 tablet 1  . lisinopril-hydrochlorothiazide (ZESTORETIC) 20-12.5 MG tablet Take 1 tablet by mouth daily. 90 tablet 3  . amLODipine (NORVASC) 10 MG tablet Take 1 tablet (10 mg total) by mouth daily. 90 tablet 3   No current facility-administered medications on file prior to visit.    ROS as in subjective   Objective BP 130/70   Pulse 82   Wt 159 lb (72.1 kg)   LMP  (LMP Unknown)   SpO2 97%   BMI 27.29 kg/m   Gen: wd, wn, nad Neck: supple, nonnteder, normal ROM, but anterior left cervical region with palpable 1+ cm dense non tender lymph node.  No other enlarged or unusual nodes, +bilat faint carotid bruits HENT unremarkable Lungs clear Heart rrr, normal s1, s2, no murmurs Ext: no edema of UE or LE Tender over left anterior shoulder at biceps origin, tender AC joint, tender left upper back, pain with general ROM of left shoulder, particularly overhead motion.  No laxity.   No swelling.  Seemingly normal shoulder ROM other than pain.  otherwise arms nontender and no deformity.  No remarkable special tests. Tender bilat calves in general, but worse tender left posteromedial calve distal 1/3, varicose veins mild of bilat LE,  worse on left, legs otherwise nonnteder and no pain with relatively normal ROM  Arms bilat 2+ pulses Right pedal pulses 1+, left pedal pulses barely palpable     Assessment:  Encounter Diagnoses  Name Primary?  . Claudication in peripheral vascular disease (HCC) Yes  . Smoker   . Chronic left shoulder pain   . Leg pain, inferior, unspecified laterality   . Bilateral carotid bruits   . Lymphadenopathy    Plan: Claudication, PVD, smoker - discussed dangers of smoking, advised cessation Referral for venous doppler LE to rule out DVT.  If negative, refer to vascular surgery here locally for consult Left shoulder pain - likely OA vs bursitis.   Will send for xray.  Gave short term pain medication for prn  use, can use some OTC aleve as well Carotid bruits - pending above studies, will refer to vascular or get doppler US of carotids Lymphadenopathy - CBC today given left neck node palpated  Courtney Bonilla was seen today for lt shoulder pain.  Diagnoses and all orders for this visit:  Claudication in peripheral vascular disease (HCC) -     CBC with Differential/Platelet -     VAS US LOWER EXTREMITY VENOUS (DVT); Future  Smoker -     CBC with Differential/Platelet -     VAS US LOWER EXTREMITY VENOUS (DVT); Future  Chronic left shoulder pain -     DG Shoulder Left; Future  Leg pain, inferior, unspecified laterality -     VAS US LOWER EXTREMITY VENOUS (DVT); Future  Bilateral carotid bruits  Lymphadenopathy -     CBC with Differential/Platelet  Other orders -     HYDROcodone-acetaminophen (NORCO) 7.5-325 MG tablet; 1/2- 1 tablet po q6 hours prn pain

## 2016-05-01 ENCOUNTER — Other Ambulatory Visit: Payer: Self-pay | Admitting: Medical

## 2016-05-01 ENCOUNTER — Ambulatory Visit
Admission: RE | Admit: 2016-05-01 | Discharge: 2016-05-01 | Disposition: A | Discharge status: Home / Self Care | Payer: BLUE CROSS/BLUE SHIELD | Source: Ambulatory Visit | Attending: Medical | Admitting: Medical

## 2016-05-01 ENCOUNTER — Telehealth: Payer: Self-pay

## 2016-05-01 ENCOUNTER — Ambulatory Visit (HOSPITAL_COMMUNITY)
Admission: RE | Admit: 2016-05-01 | Discharge: 2016-05-01 | Disposition: A | Discharge status: Home / Self Care | Payer: BLUE CROSS/BLUE SHIELD | Source: Ambulatory Visit | Attending: Medical | Admitting: Medical

## 2016-05-01 DIAGNOSIS — M25512 Pain in left shoulder: Principal | ICD-10-CM

## 2016-05-01 DIAGNOSIS — F17200 Nicotine dependence, unspecified, uncomplicated: Secondary | ICD-10-CM | POA: Diagnosis not present

## 2016-05-01 DIAGNOSIS — I739 Peripheral vascular disease, unspecified: Secondary | ICD-10-CM | POA: Diagnosis not present

## 2016-05-01 DIAGNOSIS — M79606 Pain in leg, unspecified: Secondary | ICD-10-CM | POA: Diagnosis not present

## 2016-05-01 DIAGNOSIS — G8929 Other chronic pain: Secondary | ICD-10-CM

## 2016-05-01 LAB — CBC WITH DIFFERENTIAL/PLATELET
BASOS PCT: 0 %
Basophils Absolute: 0 cells/uL (ref 0–200)
EOS PCT: 2 %
Eosinophils Absolute: 224 cells/uL (ref 15–500)
HCT: 38 % (ref 35.0–45.0)
HEMOGLOBIN: 12.8 g/dL (ref 11.7–15.5)
LYMPHS ABS: 2240 {cells}/uL (ref 850–3900)
Lymphocytes Relative: 20 %
MCH: 32.3 pg (ref 27.0–33.0)
MCHC: 33.7 g/dL (ref 32.0–36.0)
MCV: 96 fL (ref 80.0–100.0)
MONOS PCT: 5 %
MPV: 9.8 fL (ref 7.5–12.5)
Monocytes Absolute: 560 cells/uL (ref 200–950)
NEUTROS ABS: 8176 {cells}/uL — AB (ref 1500–7800)
Neutrophils Relative %: 73 %
PLATELETS: 272 10*3/uL (ref 140–400)
RBC: 3.96 MIL/uL (ref 3.80–5.10)
RDW: 15.4 % — ABNORMAL HIGH (ref 11.0–15.0)
WBC: 11.2 10*3/uL — ABNORMAL HIGH (ref 4.0–10.5)

## 2016-05-01 MED ORDER — CILOSTAZOL 50 MG PO TABS: 50.0000 mg | ORAL_TABLET | Freq: Two times a day (BID) | ORAL | 5 refills | Status: DC

## 2016-05-01 NOTE — Progress Notes (Signed)
Preliminary results by tech - Venous Duplex Lower Ext. Completed. Negative for deep and superficial vein thrombosis in both legs. Results given to [facility]Rebecca.

## 2016-05-01 NOTE — Telephone Encounter (Signed)
Vascular lab called to let you know pt was negative for DVT in left leg. Trixie Rude/RLB

## 2016-05-02 ENCOUNTER — Encounter: Payer: Self-pay | Admitting: Internal Medicine

## 2016-05-02 ENCOUNTER — Encounter: Payer: Self-pay | Admitting: Family Medicine

## 2016-05-02 ENCOUNTER — Ambulatory Visit (INDEPENDENT_AMBULATORY_CARE_PROVIDER_SITE_OTHER): Payer: BLUE CROSS/BLUE SHIELD | Admitting: Internal Medicine

## 2016-05-02 ENCOUNTER — Telehealth: Payer: Self-pay | Admitting: Pediatrics

## 2016-05-02 VITALS — BP 122/62 | HR 82 | Ht 64.0 in | Wt 160.0 lb

## 2016-05-02 DIAGNOSIS — E2740 Unspecified adrenocortical insufficiency: Secondary | ICD-10-CM | POA: Diagnosis not present

## 2016-05-02 DIAGNOSIS — E876 Hypokalemia: Secondary | ICD-10-CM

## 2016-05-02 DIAGNOSIS — E039 Hypothyroidism, unspecified: Secondary | ICD-10-CM | POA: Diagnosis not present

## 2016-05-02 DIAGNOSIS — E1165 Type 2 diabetes mellitus with hyperglycemia: Secondary | ICD-10-CM | POA: Diagnosis not present

## 2016-05-02 LAB — BASIC METABOLIC PANEL WITH GFR
BUN: 26 mg/dL — ABNORMAL HIGH (ref 7–25)
CHLORIDE: 105 mmol/L (ref 98–110)
CO2: 23 mmol/L (ref 20–31)
Calcium: 9.9 mg/dL (ref 8.6–10.4)
Creat: 1.59 mg/dL — ABNORMAL HIGH (ref 0.50–0.99)
GFR, EST NON AFRICAN AMERICAN: 35 mL/min — AB (ref >=60)
GFR, Est African American: 40 mL/min — ABNORMAL LOW (ref >=60)
GLUCOSE: 160 mg/dL — AB (ref 65–99)
Potassium: 4.5 mmol/L (ref 3.5–5.3)
SODIUM: 137 mmol/L (ref 135–146)

## 2016-05-02 LAB — T4, FREE: FREE T4: 1.21 ng/dL (ref 0.60–1.60)

## 2016-05-02 LAB — TSH: TSH: 0.12 u[IU]/mL — AB (ref 0.35–4.50)

## 2016-05-02 MED ORDER — GLIPIZIDE 5 MG PO TABS: 5.0000 mg | ORAL_TABLET | Freq: Every day | ORAL | 1 refills | Status: DC

## 2016-05-02 MED ORDER — GLIPIZIDE ER 10 MG PO TB24: 10.0000 mg | ORAL_TABLET | Freq: Every day | ORAL | 1 refills | Status: DC

## 2016-05-02 NOTE — Progress Notes (Signed)
Patient ID: Courtney Bonilla, female   DOB: [birth year], 60 y.o.   MRN: [id number]   HPI: Courtney Bonilla is a 60 y.o.-year-old female, returning for f/u for DM2, dx in 2014, non-insulin-dependent, uncontrolled, without complications, also hypothyroidism, Adrenal insufficiency, dx 01/2015. She saw endocrinologist in [place]: Dr. Joan Mayans  Reviewed Hemoglobin A1c levels 03/16/2016: HbA1c 9.2%  09/29/2015: HbA1c 6.9%  Of note, pancreatic antibodies were negative: Component     Latest Ref Rng & Units 03/16/2016  Glutamic Acid Decarb Ab     <5 IU/mL <5  Pancreatic Islet Cell Antibody     <5 JDF Units <5   Pt is on a regimen of: - Amaryl 4 mg in am - Glipizide 5 mg in a.m. Insurance did not approve Januvia. Metformin caused nausea. She also has CKD.  Pt checks her sugars 1-2x a day - brings log: - am: 82, 111-152 >> 150-155 - 2h after b'fast: n/c - before lunch: n/c >> 202 - 2h after lunch: n/c >> 250 - before dinner: n/c - 2h after dinner: n/c >> 200s, 311 - bedtime: n/c - nighttime: n/c No lows. Lowest sugar was 87; ? hypoglycemia awareness Highest sugar was 260.  Glucometer: ReliOn  Pt's meals are: - Breakfast: usually skips, but sometimes egg + toast - Lunch: sandwich - Dinner: meat + veggies - Snacks: 2: cheese + crackers, PB + crackers, candy bars after dinner, potato chips  She lost 40 lbs before AI dx. Now gained back 20 lbs.   - no CKD, last BUN/creatinine:  09/29/2015: 15/1.77, EGFR 51, ACR 84.6 Lab Results  Component Value Date   BUN 13 02/18/2015   BUN 16 02/09/2015   CREATININE 1.07 (H) 02/18/2015   CREATININE 1.24 (H) 02/09/2015  She is on lisinopril 20 mg daily. - last set of lipids: 09/29/2015: 274/534/32/134 No results found for: CHOL, HDL, LDLCALC, LDLDIRECT, TRIG, CHOLHDL  She is not on a statin. - last eye exam was in 01/2015. No DR.  - no numbness and tingling in her feet. She has PVD >> has mm pain (claudication) in L leg - has a h/o  stent.  Hypothyroidism:  She was on LT4 112 g daily - decreased from 125 g daily at last visit.  She is taking the levothyroxine: - Every day - Fasting - Usually skips breakfast or eats it more than 30 minutes later - No calcium, iron, multivitamins, PPIs  Reviewed labs: Lab Results  Component Value Date   TSH 0.21 (L) 03/16/2016  09/29/2015: TSH 0.011 - on 150 mcg  Adrenal Insufficiency:  Reviewed hx: She has a h/o Prednisone tapers x 2 before back sx in 11/2015. No steroid inj, but has had steroid packs 2x a year.   Adrenal glands appeared normal on a CT abd. 02/2016.  02/08/2015 (at dx): cortisol (10:25 am): 1.8, ACTH 19.7 02/25/2015:  21 hydroxylase antibodies <1.0, anti-adrenal antibodies: negative, cortisol 20.6, ACTH 5.6, aldosterone <1.0, PRA <0.15, Sodium 141 (135-146), TSH 1.46  04/07/2015 (patient believes these labs were obtained on 5 mg of hydrocortisone daily only): ACTH 4.2, aldosterone <1.0, PRA 0.18  She was on HC 20 mg in am and 10 in pm >> previous endocrinologist tried to taper this down >> hospitalized when decreased to 10 mg daily. At last visit, I advised her to try to decrease the dose to 15 mg in am and 10 mg in pm. She did so and she feels good on this dose. She did not have to double the dose or  get the injectable steroids since last visit  At last visit, she was telling me that she usually has a "spell" every 2 weeks >> flu-like illness, loss of appetite, generalized mm pain >> doubles the HC dose for 2-3 days >> resolves. However, no such episodes since last  She has a dx of UC in 06/2015 >> postprandial diarrhea.   ROS: Constitutional: + weight gain, + fatigue, no subjective hyperthermia/hypothermia Eyes: no blurry vision, no xerophthalmia ENT: no sore throat, no nodules palpated in throat, no dysphagia/odynophagia, no hoarseness Cardiovascular: no CP/SOB/palpitations/leg swelling Respiratory: no cough/SOB Gastrointestinal: + N/+ V/+ D/no  C Musculoskeletal: + both: muscle/joint aches Skin: no rashes Neurological: no tremors/numbness/tingling/dizziness  I reviewed pt's medications, allergies, PMH, social hx, family hx, and changes were documented in the history of present illness. Otherwise, unchanged from my initial visit note.  Past Medical History:  Diagnosis Date  . Addison disease ([place]) 2016  . Back pain   . Diabetes mellitus without complication ([place])   . Hyperlipidemia   . Hypertension   . Thyroid disease   . UC (ulcerative colitis) ([place]) 2017   Past Surgical History:  Procedure Laterality Date  . BACK SURGERY  November 26 2014   Dr Sherley Bounds  . CHOLECYSTECTOMY  03/2015  . HEMORROIDECTOMY  2015  . STENT PLACEMENT ILIAC ([place] [facility])  2014  . TOTAL ABDOMINAL HYSTERECTOMY  1981   Social History   Social History  . Marital status: Widowed    Spouse name: N/A  . Number of children: 1   Occupational History  . retired   Social History Main Topics  . Smoking status: Current Every Day Smoker, 1 pack a day   . Smokeless tobacco: Not on file  . Alcohol use No  . Drug use: No    Current Outpatient Prescriptions on File Prior to Visit  Medication Sig Dispense Refill  . amLODipine (NORVASC) 10 MG tablet Take 1 tablet (10 mg total) by mouth daily. 90 tablet 3  . glimepiride (AMARYL) 4 MG tablet Take 4 mg by mouth daily with breakfast.    . glipiZIDE (GLUCOTROL) 5 MG tablet Take 1 tablet (5 mg total) by mouth daily before breakfast. 60 tablet 2  . HYDROcodone-acetaminophen (NORCO) 7.5-325 MG tablet 1/2- 1 tablet po q6 hours prn pain 15 tablet 0  . hydrocortisone (CORTEF) 5 MG tablet Take 3 tablets in the morning & 1 tablet in the afternoon daily. 360 tablet 3  . levothyroxine (SYNTHROID, LEVOTHROID) 112 MCG tablet Take 1 tablet (112 mcg total) by mouth daily. 60 tablet 1  . lisinopril-hydrochlorothiazide (ZESTORETIC) 20-12.5 MG tablet Take 1 tablet by mouth daily. 90 tablet 3  . cilostazol (PLETAL) 50 MG tablet  Take 1 tablet (50 mg total) by mouth 2 (two) times daily. (Patient not taking: Reported on 05/02/2016) 60 tablet 5   No current facility-administered medications on file prior to visit.    No Known Allergies  Family history: See history of present illness + - Thyroid disease in mother and brother.  PE: BP 122/62   Pulse 82   Ht _0  (1.626 m)   Wt 160 lb (72.6 kg)   LMP  (LMP Unknown)   SpO2 98%   BMI 27.46 kg/m  Wt Readings from Last 3 Encounters:  05/02/16 160 lb (72.6 kg)  04/30/16 159 lb (72.1 kg)  04/06/16 156 lb (70.8 kg)   Constitutional: Slightly overweight, in NAD, extremely tanned, however, lighter complexion in areas covered by her bathing suite  Eyes: PERRLA, EOMI, no exophthalmos ENT: moist mucous membranes, no thyromegaly, no cervical lymphadenopathy Cardiovascular: RRR, No MRG Respiratory: CTA B Gastrointestinal: abdomen soft, NT, ND, BS+ Musculoskeletal: no deformities, strength intact in all 4 Skin: moist, warm, no rashes; dark skin; oral mucosal dark discoloration Neurological: no tremor with outstretched hands, DTR normal in all 4  ASSESSMENT: 1. DM2, non-insulin-dependent, uncontrolled, without long term complications, but with hyperglycemia - negative pancreatic antibodies  2. Hypothyroidism  3. Adrenal insufficiency  PLAN:  1. Patient with a history of relatively well controlled diabetes, on oral antidiabetic regimen, which became insufficient.Her HbA1c today is Improved, at 8.6 %.  - her GAD and ICA pancreatic antibodies were undetectable at last visit, but we still need to check a C-peptide - will do so at next visit - At last visit, we attempted to start Januvia, however, the insurance denied coverage (we did the PA). Will try to resend the preauthorization form. In the meantime, I would advise her to stop Amaryl, start glipizide extended-release in a.m. and start glipizide instant release before dinner - Continue checking sugars at different  times of the day - check once a day, rotating checks - advised for yearly eye exams >> she is due  - Return to clinic in 3 mo with sugar log   2. Hypothyroidism - She is taking the levothyroxine correctly - We adjusted her levothyroxine dose after the last visit's TSH, which returned suppressed >> we decreased the dose to 112 g daily - We'll continue the same dose - We'll recheck her TFTs today    3. Adrenal insufficiency - Unclear etiology, but possibly 2/2 previous steroid use. 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 She has, however increased skin and mucosa pigmentation, which points towards primary adrenal insufficiency >> will continue to treat her as adrenal insufficiency and will maintain her on hydrocortisone. - we again discussed about proper replacement with Hydrocortisone >> she did a good job decreasing her hydrocortisone to 15 mg in a.m. and 10 mg in pm and had no problems after the decrease. I encouraged her to continue to decrease the dose to an ideal dose of 15-20 mg per day. For now, will try to decrease the dose to 15 mg in a.m. and 7.5 mg in p.m. - Reiterated sick days rules - she is aware of them - advised for [place] bracelet mentioning "adrenal insufficiency". She does not have one yet.  Patient Instructions   Please continue Hydrocortisone 15 mg in am and decrease to 7.5 mg in pm, no later then 3 pm. If you feel well on this dose, you can decrease the dose to 15 mg in am and 5 mg in pm.  - You absolutely need to take this medication every day and not skip doses. - Please double the dose if you have a fever, for the duration of the fever. - If you cannot take anything by mouth (vomiting) or you have severe diarrhea so that you eliminate the hydrocortisone pills in your stool, please make sure that you get the hydrocortisone in the  vein instead - go to the nearest emergency department/urgent care or you may go to your PCPs office  - Please try to get a MedAlert bracelet or pendant indicating: "Adrenal insufficiency".  Continue Levothyroxine 112 mcg daily.  Take the thyroid hormone every day, with water, at least 30 minutes  before breakfast, separated by at least 4 hours from: - acid reflux medications - calcium - iron - multivitamins  Please stop: - Amaryl 4 mg in a.m.  Start: - Glipizide ER 10 mg in am  Move: - Glipizide 5 mg before dinner  We will try to get Januvia approved: 100 mg before b'fast   Before b'fast Before dinner  Januvia 100 mg x   Glipizide ER/XL 10 mg x   Glipizide 5 mg  x   Please return in 3 months with your sugar log.   - time spent with the patient: 40 minutes, of which >50% was spent in obtaining information about her symptoms, reviewing her previous labs, evaluations, and treatments, counseling her about her conditions (please see the discussed topics above), and developing a plan to further investigate and treat them.  Component     Latest Ref Rng & Units 05/02/2016  Sodium     135 - 146 mmol/L 137  Potassium     3.5 - 5.3 mmol/L 4.5  Chloride     98 - 110 mmol/L 105  CO2     20 - 31 mmol/L 23  Glucose     65 - 99 mg/dL 160 (H)  BUN     7 - 25 mg/dL 26 (H)  Creatinine     0.50 - 0.99 mg/dL 1.59 (H)  Calcium     8.6 - 10.4 mg/dL 9.9  GFR, Est African American     >=60 mL/min 40 (L)  GFR, Est Non African American     >=60 mL/min 35 (L)  TSH     0.35 - 4.50 uIU/mL 0.12 (L)  T4,Free(Direct)     0.60 - 1.60 ng/dL 1.21   TSH is still low, and I wonder whether this is related to her hydrocortisone dosing, since she has no signs of thyrotoxicosis. For now, I will continue the current levothyroxine dose but we will need to recheck her TSH before taking hydrocortisone at next visit. Her kidney function is slightly lower compared to before. I will send the results to her  PCP  Philemon Kingdom, MD PhD [facility]

## 2016-05-02 NOTE — Telephone Encounter (Signed)
[facility] She states Januvia has been denied for PA previously.  She states it must now be submitted for Provider Courtesy Review. I faxed notes as requested to [phone] with the "Key# [id number]".  They will contact with decision or verbal consult with physician.

## 2016-05-02 NOTE — Telephone Encounter (Signed)
Thank you so much

## 2016-05-02 NOTE — Patient Instructions (Addendum)
Please continue Hydrocortisone 15 mg in am and decrease to 7.5 mg in pm, no later then 3 pm. If you feel well on this dose, you can decrease the dose to 15 mg in am and 5 mg in pm.  - You absolutely need to take this medication every day and not skip doses. - Please double the dose if you have a fever, for the duration of the fever. - If you cannot take anything by mouth (vomiting) or you have severe diarrhea so that you eliminate the hydrocortisone pills in your stool, please make sure that you get the hydrocortisone in the vein instead - go to the nearest emergency department/urgent care or you may go to your PCPs office  - Please try to get a MedAlert bracelet or pendant indicating: "Adrenal insufficiency".  Continue Levothyroxine 112 mcg daily.  Take the thyroid hormone every day, with water, at least 30 minutes before breakfast, separated by at least 4 hours from: - acid reflux medications - calcium - iron - multivitamins  Please stop: - Amaryl 4 mg in a.m.  Start: - Glipizide ER 10 mg in am  Move: - Glipizide 5 mg before dinner  We will try to get Januvia approved: 100 mg before b'fast   Before b'fast Before dinner  Januvia 100 mg x   Glipizide ER/XL 10 mg x   Glipizide 5 mg  x   Please return in 3 months with your sugar log.

## 2016-05-14 ENCOUNTER — Other Ambulatory Visit: Payer: Self-pay | Admitting: Internal Medicine

## 2016-05-14 MED ORDER — SAXAGLIPTIN HCL 5 MG PO TABS: 5.0000 mg | ORAL_TABLET | Freq: Every day | ORAL | 0 refills | Status: DC

## 2016-05-21 ENCOUNTER — Ambulatory Visit (INDEPENDENT_AMBULATORY_CARE_PROVIDER_SITE_OTHER): Payer: BLUE CROSS/BLUE SHIELD | Admitting: Family Medicine

## 2016-05-21 ENCOUNTER — Encounter: Payer: Self-pay | Admitting: Family Medicine

## 2016-05-21 VITALS — BP 124/70 | HR 97 | Ht 64.0 in | Wt 165.0 lb

## 2016-05-21 DIAGNOSIS — Z1231 Encounter for screening mammogram for malignant neoplasm of breast: Secondary | ICD-10-CM | POA: Diagnosis not present

## 2016-05-21 DIAGNOSIS — F17200 Nicotine dependence, unspecified, uncomplicated: Secondary | ICD-10-CM | POA: Diagnosis not present

## 2016-05-21 DIAGNOSIS — E1165 Type 2 diabetes mellitus with hyperglycemia: Secondary | ICD-10-CM | POA: Diagnosis not present

## 2016-05-21 DIAGNOSIS — I739 Peripheral vascular disease, unspecified: Secondary | ICD-10-CM | POA: Diagnosis not present

## 2016-05-21 DIAGNOSIS — K51919 Ulcerative colitis, unspecified with unspecified complications: Secondary | ICD-10-CM | POA: Diagnosis not present

## 2016-05-21 DIAGNOSIS — Z23 Encounter for immunization: Secondary | ICD-10-CM | POA: Diagnosis not present

## 2016-05-21 DIAGNOSIS — Z1239 Encounter for other screening for malignant neoplasm of breast: Secondary | ICD-10-CM

## 2016-05-21 DIAGNOSIS — R0989 Other specified symptoms and signs involving the circulatory and respiratory systems: Secondary | ICD-10-CM | POA: Diagnosis not present

## 2016-05-21 LAB — POCT GLYCOSYLATED HEMOGLOBIN (HGB A1C): Hemoglobin A1C: 8.3

## 2016-05-21 NOTE — Patient Instructions (Signed)
Call [phone] quit now 

## 2016-05-21 NOTE — Progress Notes (Signed)
Subjective:    Patient ID: Courtney Bonilla Hoyt, female    DOB: 08/23/1955, 60 y.o.   MRN: [id number]  Courtney Bonilla Dug[place] is a 60 y.o. female who presents for follow-up of Type 2 diabetes mellitus.She was seen by Dr. Elvera LennoxGherghe for follow-up on diabetes, adrenal insufficiency and hypothyroid. Her medications were readjusted at that point. The steroids were reduced. She was recently switched to Onglyza and she is on glipizide. She does complain of fatigue. She also was seen by Dr. Christella HartiganJacobs follow-up on her ulcerative colitis. He is waiting on data from her previous physician concerning this. She does continue to complain of lower extremity pain especially with physical activity. She is scheduled to see cardiovascular follow-up on this. She has decreased her smoking to one half pack per day.  Patient is checking home blood sugars.   Home blood sugar records: 128 to 311 How often is blood sugars being checked:once a day Current symptoms/problems none Daily foot checks: yes  Any foot concerns: feet bother her Last eye exam: 01/26/15 Exercise: just to tired  The following portions of the patient's history were reviewed and updated as appropriate: allergies, current medications, past medical history, past social history and problem list.  ROS as in subjective above.     Objective:    Physical Exam Alert and in no distress Carotid exam shows no murmurs bilaterally. Lower extremity exam shows the skin to be normal however pulses were difficult to feel. Capillary refill seem to be diminished.  Height 5\' 4"  (1.626 m), weight 165 lb (74.8 kg).  Lab Review Diabetic Labs Latest Ref Rng & Units 05/02/2016 03/16/2016 02/18/2015 02/09/2015 02/08/2015  HbA1c - - 9.2 - - -  Creatinine 0.50 - 0.99 mg/dL 4.09(W1.59(H) - 1.19(J1.07(H) 4.78(G1.24(H) 1.63(H)   BP/Weight 05/21/2016 05/02/2016 04/30/2016 04/06/2016 03/16/2016  Systolic BP - 122 130 108 127  Diastolic BP - 62 70 56 69  Wt. (Lbs) 165 160 159 156 155  BMI 28.32 27.46 27.29 26.78  26.19   Foot/eye exam completion dates Latest Ref Rng & Units 01/25/2016  Eye Exam No Retinopathy No Retinopathy  Foot Form Completion - -  Hemoglobin A1c is 8.3  Breanah  reports that she has been smoking.  She has never used smokeless tobacco. She reports that she does not drink alcohol or use drugs.     Assessment & Plan:    Carotid bruit, unspecified laterality  Claudication in peripheral vascular disease (HCC) - Plan: Ultrasound doppler arterial leg left  Smoker  Need for prophylactic vaccination with combined diphtheria-tetanus-pertussis (DTP) vaccine - Plan: Tdap vaccine greater than or equal to 7yo IM  Need for prophylactic vaccination against Streptococcus pneumoniae (pneumococcus) - Plan: Pneumococcal conjugate vaccine 13-valent  Type 2 diabetes mellitus with hyperglycemia, without long-term current use of insulin (HCC) - Plan: HgB A1c  Screening for breast cancer - Plan: MM DIGITAL SCREENING BILATERAL  Ulcerative colitis with complication, unspecified location (HCC)    1. Rx changes: none 2. Education: Reviewed 'ABCs' of diabetes management (respective goals in parentheses):  A1C (<7), blood pressure (<130/80), and cholesterol (LDL <100). 3. Compliance at present is estimated to be fair. Efforts to improve compliance (if necessary) will be directed at increased exercise. 4. Follow up: 6 months I explained that her care concerning diabetes, thyroid and adrenal insufficiency will be handled by Dr. Elvera LennoxGherghe instead of here in my office. I will take care of her primary care needs but turn over the endocrine issues at this time. She will be set up to  get carotid Doppler as well as lower extremity arterial Dopplers. Her immunizations were updated. She did have a hysterectomy in 1991. Mammogram will also be set up.

## 2016-05-22 ENCOUNTER — Telehealth: Payer: Self-pay | Admitting: Gastroenterology

## 2016-05-22 NOTE — Telephone Encounter (Signed)
That's great, no changes for now.  rov with me in 2 months.  thanks

## 2016-05-22 NOTE — Telephone Encounter (Signed)
Colonoscopy 2017 [place] Dr. Allena KatzPatel; described "trace patchy colitis throughout the colon.  Normal TI."  Path results read "non specific chronic inflammation of the colon"  Please call her, ask how her loose stools have been since she started twice daily imodium 6 weeks ago.  Thanks

## 2016-05-22 NOTE — Telephone Encounter (Signed)
Patient reports that diarrhea has resolved on imodium.  She is using imodium at least once a day.  "I'm doing well.

## 2016-05-22 NOTE — Telephone Encounter (Signed)
Patient notified  She will follow up on 07/31/16 3:45

## 2016-05-29 ENCOUNTER — Other Ambulatory Visit: Payer: Self-pay

## 2016-05-29 DIAGNOSIS — R0989 Other specified symptoms and signs involving the circulatory and respiratory systems: Secondary | ICD-10-CM

## 2016-06-29 ENCOUNTER — Encounter: Payer: Self-pay | Admitting: Vascular Surgery

## 2016-07-04 ENCOUNTER — Encounter: Payer: BLUE CROSS/BLUE SHIELD | Admitting: Vascular Surgery

## 2016-07-04 ENCOUNTER — Encounter (HOSPITAL_COMMUNITY): Payer: BLUE CROSS/BLUE SHIELD

## 2016-07-06 ENCOUNTER — Other Ambulatory Visit: Payer: Self-pay | Admitting: Internal Medicine

## 2016-07-11 ENCOUNTER — Ambulatory Visit (INDEPENDENT_AMBULATORY_CARE_PROVIDER_SITE_OTHER): Payer: BLUE CROSS/BLUE SHIELD | Admitting: Medical

## 2016-07-11 ENCOUNTER — Encounter: Payer: Self-pay | Admitting: Medical

## 2016-07-11 VITALS — BP 132/70 | HR 81 | Temp 98.3°F | Wt 162.4 lb

## 2016-07-11 DIAGNOSIS — R6889 Other general symptoms and signs: Secondary | ICD-10-CM | POA: Diagnosis not present

## 2016-07-11 DIAGNOSIS — E1165 Type 2 diabetes mellitus with hyperglycemia: Secondary | ICD-10-CM | POA: Diagnosis not present

## 2016-07-11 DIAGNOSIS — E2740 Unspecified adrenocortical insufficiency: Secondary | ICD-10-CM | POA: Diagnosis not present

## 2016-07-11 DIAGNOSIS — N289 Disorder of kidney and ureter, unspecified: Secondary | ICD-10-CM | POA: Diagnosis not present

## 2016-07-11 LAB — BASIC METABOLIC PANEL
BUN: 24 mg/dL (ref 7–25)
CHLORIDE: 106 mmol/L (ref 98–110)
CO2: 19 mmol/L — ABNORMAL LOW (ref 20–31)
CREATININE: 2.4 mg/dL — AB (ref 0.50–0.99)
Calcium: 9.7 mg/dL (ref 8.6–10.4)
Glucose, Bld: 171 mg/dL — ABNORMAL HIGH (ref 65–99)
POTASSIUM: 4.4 mmol/L (ref 3.5–5.3)
SODIUM: 137 mmol/L (ref 135–146)

## 2016-07-11 LAB — POC INFLUENZA A&B (BINAX/QUICKVUE)
INFLUENZA A, POC: NEGATIVE
INFLUENZA B, POC: NEGATIVE

## 2016-07-11 LAB — CBC
HCT: 37.9 % (ref 35.0–45.0)
HEMOGLOBIN: 13 g/dL (ref 11.7–15.5)
MCH: 34 pg — ABNORMAL HIGH (ref 27.0–33.0)
MCHC: 34.3 g/dL (ref 32.0–36.0)
MCV: 99.2 fL (ref 80.0–100.0)
MPV: 9.6 fL (ref 7.5–12.5)
PLATELETS: 270 10*3/uL (ref 140–400)
RBC: 3.82 MIL/uL (ref 3.80–5.10)
RDW: 14.4 % (ref 11.0–15.0)
WBC: 9.8 10*3/uL (ref 4.0–10.5)

## 2016-07-11 NOTE — Progress Notes (Signed)
Subjective: Chief Complaint  Patient presents with  . possible flu    headache, fever, cougjing, congestion    Here for 3 day hx/o not felling well.   Having headache, cough, head congestion, eyes seems swollen, sore throat, gland swelling in neck.    Subjective fever.  Body aches, chills.   Her brother in law is seeing Dr. Susann GivensLalonde today with same symptoms.   Using ibuprofen.  Doubled up on steroids she takes for Addison's disease short term.  left eye with some drainage or crusting.  Is hydrating well, using fever reducer OTC.  Doesn't get sick often.   No other aggravating or relieving factors. No other complaint.   Past Medical History:  Diagnosis Date  . Addison disease (HCC) 2016  . Back pain   . Diabetes mellitus without complication (HCC)   . Hyperlipidemia   . Hypertension   . Thyroid disease   . UC (ulcerative colitis) ([facility]) 2017   Current Outpatient Prescriptions on File Prior to Visit  Medication Sig Dispense Refill  . amLODipine (NORVASC) 10 MG tablet Take 1 tablet (10 mg total) by mouth daily. 90 tablet 3  . cilostazol (PLETAL) 50 MG tablet Take 1 tablet (50 mg total) by mouth 2 (two) times daily. 60 tablet 5  . glipiZIDE (GLUCOTROL XL) 10 MG 24 hr tablet Take 1 tablet (10 mg total) by mouth daily with breakfast. 90 tablet 1  . glipiZIDE (GLUCOTROL) 5 MG tablet Take 1 tablet (5 mg total) by mouth daily before supper. 90 tablet 1  . hydrocortisone (CORTEF) 5 MG tablet Take 3 tablets in the morning & 1 tablet in the afternoon daily. 360 tablet 3  . levothyroxine (SYNTHROID, LEVOTHROID) 112 MCG tablet TAKE ONE TABLET BY MOUTH ONCE DAILY 60 tablet 1  . lisinopril-hydrochlorothiazide (ZESTORETIC) 20-12.5 MG tablet Take 1 tablet by mouth daily. 90 tablet 3  . saxagliptin HCl (ONGLYZA) 5 MG TABS tablet Take 1 tablet (5 mg total) by mouth daily. 90 tablet 0   No current facility-administered medications on file prior to visit.    ROS as in subjective   Objective: BP 132/70    Pulse 81   Temp 98.3 F (36.8 C)   Wt 162 lb 6.4 oz (73.7 kg)   LMP  (LMP Unknown)   SpO2 99%   BMI 27.88 kg/m   General: Ill-appearing, well-developed, well-nourished Skin: warm, dry HEENT: Nose inflamed and congested, clear conjunctiva, TMs pearly, no sinus tenderness, pharynx with erythema, no exudates Neck: Supple, non tender, shotty cervical adenopathy Heart: Regular rate and rhythm, normal S1, S2, no murmurs Lungs: Clear to auscultation bilaterally, no wheezes, rales, rhonchi Abdomen: Non tender non distended Extremities: Mild generalized tenderness   Assessment: Encounter Diagnoses  Name Primary?  . Flu-like symptoms Yes  . Renal insufficiency   . Adrenal insufficiency (HCC)   . Type 2 diabetes mellitus with hyperglycemia, without long-term current use of insulin (HCC)      Plan: Discussed case with Dr. Susann GivensLalonde supervising physician.   Her brother in law was here today diagnosed with flu as well.  advised that her symptoms suggest influenza clinically, despite negative flu test.    Discussed diagnosis of influenza. Discussed supportive care including rest, hydration, OTC Tylenol or NSAID for fever, aches, and malaise.  Discussed period of contagion, self quarantine at home away from others to avoid spread of disease, discussed means of transmission, and possible complications including pneumonia.  If worse or not improving within the next 4-5 days, then  call or return.  Labs today given elevated creatinine 2 mo ago.  Patient voiced understanding of diagnosis, recommendations, and treatment plan.

## 2016-07-16 ENCOUNTER — Other Ambulatory Visit: Payer: BLUE CROSS/BLUE SHIELD

## 2016-07-16 DIAGNOSIS — R7989 Other specified abnormal findings of blood chemistry: Secondary | ICD-10-CM

## 2016-07-16 LAB — BASIC METABOLIC PANEL
BUN: 20 mg/dL (ref 7–25)
CHLORIDE: 105 mmol/L (ref 98–110)
CO2: 23 mmol/L (ref 20–31)
Calcium: 9.4 mg/dL (ref 8.6–10.4)
Creat: 1.78 mg/dL — ABNORMAL HIGH (ref 0.50–0.99)
Glucose, Bld: 179 mg/dL — ABNORMAL HIGH (ref 65–99)
POTASSIUM: 4 mmol/L (ref 3.5–5.3)
SODIUM: 136 mmol/L (ref 135–146)

## 2016-07-20 ENCOUNTER — Telehealth: Payer: Self-pay | Admitting: Family Medicine

## 2016-07-20 NOTE — Telephone Encounter (Signed)
Phone not working sent message on my chart

## 2016-07-20 NOTE — Telephone Encounter (Signed)
She should stay on her present medications and if there are any specific issues she should discuss this with her endocrinologist just to be safe

## 2016-07-20 NOTE — Telephone Encounter (Signed)
Pt called to get interpretation of lab results from 07/16/16 and want to know if she should stay on same meds. Pt saw results in MyChart

## 2016-07-31 ENCOUNTER — Encounter: Payer: Self-pay | Admitting: Gastroenterology

## 2016-07-31 ENCOUNTER — Ambulatory Visit (INDEPENDENT_AMBULATORY_CARE_PROVIDER_SITE_OTHER): Payer: BLUE CROSS/BLUE SHIELD | Admitting: Gastroenterology

## 2016-07-31 VITALS — BP 130/72 | HR 84 | Ht 64.0 in | Wt 165.0 lb

## 2016-07-31 DIAGNOSIS — K529 Noninfective gastroenteritis and colitis, unspecified: Secondary | ICD-10-CM | POA: Diagnosis not present

## 2016-07-31 NOTE — Progress Notes (Signed)
Review of pertinent gastrointestinal problems: 1.  EGD Dr. Allena KatzPatel : done for abd pain, chronic GERD, chronic nausea:  "Mild distal esophagitis, grade 1, with slightly irregular Z line. Small hiatal hernia. Mild gastritis. Mild duodenitis. Biopsies taken". He recommended she increase her omeprazole 40 mg, add 300 mg of Zantac twice daily. He recommended repeat EGD in 2 years to "reassess the esophagus" 2. Chronic colitis: Colonoscopy 2017 [place] Dr. Allena KatzPatel; described "trace patchy colitis throughout the colon.  Normal TI."  Path results read "non specific chronic inflammation of the colon"   HPI: This is a very pleasant 61 year old woman whom I last saw several months ago.  I finally received records from her previous gastroenterologist and we set up this return visit.  Chief complaint is history of colitis  She does not have trouble with her bowels now. While her hydrocortisone has decreased her diarrhea has lessened.   Currently having a BM every 2-3 days.  Has not taken imodium in a long time now.  No overt bleeding  She is gaining weight.    ROS: complete GI ROS as described in HPI.  Constitutional:  No unintentional weight loss   Past Medical History:  Diagnosis Date  . Addison disease (HCC) 2016  . Back pain   . Diabetes mellitus without complication (HCC)   . Hyperlipidemia   . Hypertension   . Thyroid disease   . UC (ulcerative colitis) (HCC) 2017    Past Surgical History:  Procedure Laterality Date  . BACK SURGERY  November 26 2014   Dr Marikay Alaravid Jones  . CHOLECYSTECTOMY  03/2015  . HEMORROIDECTOMY  2015  . STENT PLACEMENT ILIAC ([facility] HX)  2014  . TOTAL ABDOMINAL HYSTERECTOMY  1981    Current Outpatient Prescriptions  Medication Sig Dispense Refill  . amLODipine (NORVASC) 10 MG tablet Take 1 tablet (10 mg total) by mouth daily. 90 tablet 3  . cilostazol (PLETAL) 50 MG tablet Take 1 tablet (50 mg total) by mouth 2 (two) times daily. 60 tablet 5  . glipiZIDE (GLUCOTROL XL)  10 MG 24 hr tablet Take 1 tablet (10 mg total) by mouth daily with breakfast. 90 tablet 1  . glipiZIDE (GLUCOTROL) 5 MG tablet Take 1 tablet (5 mg total) by mouth daily before supper. 90 tablet 1  . hydrocortisone (CORTEF) 5 MG tablet Take 3 tablets in the morning & 1 tablet in the afternoon daily. (Patient taking differently: Take 2  tablets in the morning & 1 tablet in the afternoon daily.) 360 tablet 3  . levothyroxine (SYNTHROID, LEVOTHROID) 112 MCG tablet TAKE ONE TABLET BY MOUTH ONCE DAILY 60 tablet 1  . lisinopril-hydrochlorothiazide (ZESTORETIC) 20-12.5 MG tablet Take 1 tablet by mouth daily. 90 tablet 3  . saxagliptin HCl (ONGLYZA) 5 MG TABS tablet Take 1 tablet (5 mg total) by mouth daily. 90 tablet 0   No current facility-administered medications for this visit.     Allergies as of 07/31/2016  . (No Known Allergies)    Family History  Problem Relation Age of Onset  . Diabetes Mother   . Hypertension Mother   . Thyroid disease Mother   . Hypertension Father   . Diabetes Sister   . Hypertension Sister   . Thyroid disease Sister   . Diabetes Brother   . Hypertension Brother   . Thyroid disease Brother     Social History   Social History  . Marital status: Widowed    Spouse name: N/A  . Number of children: 1  .  Years of education: N/A   Occupational History  . retired    Social History Main Topics  . Smoking status: Current Every Day Smoker  . Smokeless tobacco: Never Used  . Alcohol use No  . Drug use: No  . Sexual activity: Not on file   Other Topics Concern  . Not on file   Social History Narrative  . No narrative on file     Physical Exam: BP 130/72   Pulse 84   Ht 5\' 4"  (1.626 m)   Wt 165 lb (74.8 kg)   LMP  (LMP Unknown)   BMI 28.32 kg/m  Constitutional: generally well-appearing Psychiatric: alert and oriented x3 Abdomen: soft, nontender, nondistended, no obvious ascites, no peritoneal signs, normal bowel sounds No peripheral edema noted  in lower extremities  Assessment and plan: 61 y.o. female with History of mild patchy colitis, diarrhea  I am not sure that she has underlying ulcerative colitis. Certainly now she has no symptoms of it. She is on hydrocortisone for Addison's disease and the doses of that have been slowly decreased over time. Her bowels are actually worse when the doses were higher. I think for now hours can observe her clinically and she knows to call she has any return of significant diarrhea or colitis symptoms.  Please see the "Patient Instructions" section for addition details about the plan.  Rob Bunting, MD [facility] 07/31/2016, 3:38 PM

## 2016-07-31 NOTE — Patient Instructions (Addendum)
You have been given a separate informational sheet regarding your tobacco use, the importance of quitting and local resources to help you quit. Call if you have any further problems with your bowels, loose stools.

## 2016-08-02 ENCOUNTER — Ambulatory Visit (INDEPENDENT_AMBULATORY_CARE_PROVIDER_SITE_OTHER): Payer: BLUE CROSS/BLUE SHIELD | Admitting: Internal Medicine

## 2016-08-02 ENCOUNTER — Encounter: Payer: Self-pay | Admitting: Internal Medicine

## 2016-08-02 VITALS — BP 122/68 | HR 81 | Ht 64.5 in | Wt 165.0 lb

## 2016-08-02 DIAGNOSIS — E039 Hypothyroidism, unspecified: Secondary | ICD-10-CM | POA: Diagnosis not present

## 2016-08-02 DIAGNOSIS — E2740 Unspecified adrenocortical insufficiency: Secondary | ICD-10-CM

## 2016-08-02 DIAGNOSIS — R5383 Other fatigue: Secondary | ICD-10-CM | POA: Diagnosis not present

## 2016-08-02 DIAGNOSIS — E1165 Type 2 diabetes mellitus with hyperglycemia: Secondary | ICD-10-CM | POA: Diagnosis not present

## 2016-08-02 LAB — VITAMIN D 25 HYDROXY (VIT D DEFICIENCY, FRACTURES): VITD: 28.56 ng/mL — AB (ref 30.00–100.00)

## 2016-08-02 LAB — T4, FREE: FREE T4: 0.99 ng/dL (ref 0.60–1.60)

## 2016-08-02 LAB — TSH: TSH: 2.45 u[IU]/mL (ref 0.35–4.50)

## 2016-08-02 LAB — VITAMIN B12: VITAMIN B 12: 140 pg/mL — AB (ref 211–911)

## 2016-08-02 NOTE — Patient Instructions (Addendum)
  Please try a higher dose of Hydrocortisone: 12.5 mg in am 7.5 mg in pm   - You absolutely need to take this medication every day and not skip doses. - Please double the dose if you have a fever, for the duration of the fever. - If you cannot take anything by mouth (vomiting) or you have severe diarrhea so that you eliminate the hydrocortisone pills in your stool, please make sure that you get the hydrocortisone in the vein instead - go to the nearest emergency department/urgent care or you may go to your PCPs office  - Please try to get a MedAlert bracelet or pendant indicating: "Adrenal insufficiency".  Continue Levothyroxine 112 mcg daily.  Take the thyroid hormone every day, with water, at least 30 minutes before breakfast, separated by at least 4 hours from: - acid reflux medications - calcium - iron  Please continue: - Glipizide ER 10 mg in am - Glipizide 5 mg before dinner - Onglyza 5 mg before b'fast   Before b'fast Before dinner  Onglyza 5 mg x   Glipizide ER/XL 10 mg x   Glipizide 5 mg  x   Please return in 3 months with your sugar log.

## 2016-08-02 NOTE — Progress Notes (Signed)
Patient ID: Courtney Bonilla, female   DOB: 1956/06/10, 61 y.o.   MRN: [id number]   HPI: Courtney Bonilla is a 61 y.o.-year-old female, returning for f/u for DM2, dx in 2014, non-insulin-dependent, uncontrolled, without complications, also hypothyroidism, Adrenal insufficiency, dx 01/2015. Last OV 3 mo ago. She saw endocrinologist in [place]: Dr. Joan Mayans  She had the flu in 06/2016.  Reviewed Hemoglobin A1c levels Lab Results  Component Value Date   HGBA1C 8.3 05/21/2016   HGBA1C 9.2 03/16/2016   09/29/2015: HbA1c 6.9%  Of note, pancreatic antibodies were negative: Component     Latest Ref Rng & Units 03/16/2016  Glutamic Acid Decarb Ab     <5 IU/mL <5  Pancreatic Islet Cell Antibody     <5 JDF Units <5   Pt is on a regimen of:   Before b'fast Before dinner  Onglyza 5 mg x   Glipizide ER/XL 10 mg x   Glipizide 5 mg  x   Insurance did not approve Januvia. Metformin caused nausea. She also has CKD.  Pt checks her sugars 1-2x a day >> better: - am: 82, 111-152 >> 150-155 >> 89-90s - 2h after b'fast: n/c - before lunch: n/c >> 202 >> 130s - 2h after lunch: n/c >> 250 >> n/c - before dinner: n/c - 2h after dinner: n/c >> 200s, 311 >> 170-192 - bedtime: n/c - nighttime: n/c No lows. Lowest sugar was 87 >> 89; ? hypoglycemia awareness Highest sugar was 260 >> 192.  Glucometer: ReliOn  Pt's meals are: - Breakfast: usually skips, but sometimes egg + toast - Lunch: sandwich - Dinner: meat + veggies - Snacks: 2: cheese + crackers, PB + crackers, candy bars after dinner, potato chips  She lost 40 lbs before AI dx. Now gained back 20 lbs.   - no CKD, last BUN/creatinine:  Lab Results  Component Value Date   BUN 20 07/16/2016   BUN 24 07/11/2016   CREATININE 1.78 (H) 07/16/2016   CREATININE 2.40 (H) 07/11/2016  09/29/2015: 15/1.77, EGFR 51, ACR 84.6 She is on lisinopril 20 mg daily. Was off Lisinopril for a period of time 2/2 increased Cr >> now back on it. - last  set of lipids: 09/29/2015: 274/534/32/134 No results found for: CHOL, HDL, LDLCALC, LDLDIRECT, TRIG, CHOLHDL  She is not on a statin. - last eye exam was in 01/2015. No DR.  - no numbness and tingling in her feet. She has PVD >> has mm pain (claudication) in L leg - has a h/o stent.  Hypothyroidism:  She is on LT4 112 g daily. Does not miss doses.  She is taking the levothyroxine: - Every day - Fasting - Usually skips breakfast or eats it more than 30 minutes later - No calcium, iron, multivitamins, PPIs  Reviewed labs: Lab Results  Component Value Date   TSH 0.12 (L) 05/02/2016  09/29/2015: TSH 0.011 - on 150 mcg  Last TSH was slightly low but she took [facility] before labs.  Adrenal Insufficiency:  Reviewed hx: She has a h/o Prednisone tapers x 2 before back sx in 11/2015. No steroid inj, but has had steroid packs 2x a year.   Adrenal glands appeared normal on a CT abd. 02/2016.  02/08/2015 (at dx): cortisol (10:25 am): 1.8, ACTH 19.7 02/25/2015:  21 hydroxylase antibodies <1.0, anti-adrenal antibodies: negative, cortisol 20.6, ACTH 5.6, aldosterone <1.0, PRA <0.15, Sodium 141 (135-146), TSH 1.46  04/07/2015 (patient believes these labs were obtained on 5 mg of hydrocortisone daily only): ACTH  4.2, aldosterone <1.0, PRA 0.18  She was on HC 20 mg in am and 10 in pm >> previous endocrinologist tried to taper this down >> hospitalized when decreased to 10 mg daily.   At last visit, I advised her to try to decrease the pm HC dose to 7.5 or even 5 mg. She is now taking 10 mg in am and 5 mg in the pm. She feels tired on this dose, joint pain.  At last visits, she was telling me that she usually has a "spell" every 2 weeks >> flu-like illness, loss of appetite, generalized mm pain >> doubles the HC dose for 2-3 days >> resolves. She now seldom has these.  She has a dx of UC in 06/2015 >> postprandial diarrhea.   ROS: Constitutional: + weight gain, + fatigue, no subjective  hyperthermia/hypothermia, + poor sleep Eyes: no blurry vision, no xerophthalmia ENT: no sore throat, no nodules palpated in throat, no dysphagia/odynophagia, no hoarseness Cardiovascular: no CP/SOB/palpitations/leg swelling Respiratory: no cough/SOB Gastrointestinal: no N/V/D/C Musculoskeletal: + both: muscle/joint aches Skin: no rashes Neurological: no tremors/numbness/tingling/dizziness  I reviewed pt's medications, allergies, PMH, social hx, family hx, and changes were documented in the history of present illness. Otherwise, unchanged from my initial visit note.  Past Medical History:  Diagnosis Date  . Addison disease ([place]) 2016  . Back pain   . Diabetes mellitus without complication ([place])   . Hyperlipidemia   . Hypertension   . Thyroid disease   . UC (ulcerative colitis) ([place]) 2017   Past Surgical History:  Procedure Laterality Date  . BACK SURGERY  November 26 2014   Dr Sherley Bounds  . CHOLECYSTECTOMY  03/2015  . HEMORROIDECTOMY  2015  . STENT PLACEMENT ILIAC ([facility] HX)  2014  . TOTAL ABDOMINAL HYSTERECTOMY  1981   Social History   Social History  . Marital status: Widowed    Spouse name: N/A  . Number of children: 1   Occupational History  . retired   Social History Main Topics  . Smoking status: Current Every Day Smoker, 1 pack a day   . Smokeless tobacco: Not on file  . Alcohol use No  . Drug use: No    Current Outpatient Prescriptions on File Prior to Visit  Medication Sig Dispense Refill  . amLODipine (NORVASC) 10 MG tablet Take 1 tablet (10 mg total) by mouth daily. 90 tablet 3  . cilostazol (PLETAL) 50 MG tablet Take 1 tablet (50 mg total) by mouth 2 (two) times daily. 60 tablet 5  . glipiZIDE (GLUCOTROL XL) 10 MG 24 hr tablet Take 1 tablet (10 mg total) by mouth daily with breakfast. 90 tablet 1  . glipiZIDE (GLUCOTROL) 5 MG tablet Take 1 tablet (5 mg total) by mouth daily before supper. 90 tablet 1  . hydrocortisone (CORTEF) 5 MG tablet Take 3 tablets in  the morning & 1 tablet in the afternoon daily. (Patient taking differently: Take 2  tablets in the morning & 1 tablet in the afternoon daily.) 360 tablet 3  . levothyroxine (SYNTHROID, LEVOTHROID) 112 MCG tablet TAKE ONE TABLET BY MOUTH ONCE DAILY 60 tablet 1  . lisinopril-hydrochlorothiazide (ZESTORETIC) 20-12.5 MG tablet Take 1 tablet by mouth daily. 90 tablet 3  . saxagliptin HCl (ONGLYZA) 5 MG TABS tablet Take 1 tablet (5 mg total) by mouth daily. 90 tablet 0   No current facility-administered medications on file prior to visit.    No Known Allergies  Family history: See history of present illness + -  Thyroid disease in mother and brother.  PE: BP 122/68 (BP Location: Left Arm, Patient Position: Sitting)   Pulse 81   Ht 5' 4.5" (1.638 m)   Wt 165 lb (74.8 kg)   LMP  (LMP Unknown)   SpO2 95%   BMI 27.88 kg/m  Wt Readings from Last 3 Encounters:  08/02/16 165 lb (74.8 kg)  07/31/16 165 lb (74.8 kg)  07/11/16 162 lb 6.4 oz (73.7 kg)   Constitutional: Slightly overweight, in NAD, extremely tanned, however, lighter complexion in areas covered by her bathing suite Eyes: PERRLA, EOMI, no exophthalmos ENT: moist mucous membranes, no thyromegaly, no cervical lymphadenopathy Cardiovascular: RRR, No MRG Respiratory: CTA B Gastrointestinal: abdomen soft, NT, ND, BS+ Musculoskeletal: no deformities, strength intact in all 4 Skin: moist, warm, no rashes Neurological: no tremor with outstretched hands, DTR normal in all 4  ASSESSMENT: 1. DM2, non-insulin-dependent, uncontrolled, without long term complications, but with hyperglycemia - negative pancreatic antibodies  2. Hypothyroidism  3. Adrenal insufficiency  4. Fatigue  PLAN:  1. Patient with a history of relatively well controlled diabetes, on oral antidiabetic regimen, now with much improved control on Glipizide and Onglyza. Her HbA1cwas Improved at last visit, at 8.6 %.  - her GAD and ICA pancreatic antibodies were  undetectable at last visit, but we still need to check a C-peptide - will do so today - Continue checking sugars at different times of the day - check once a day, rotating checks - advised for yearly eye exams >> she goes in 01/2017 - Return to clinic in 3 mo with sugar log   2. Hypothyroidism - She is taking the levothyroxine 112 mcg correctly - last TSH suppressed >> we continued 112 g daily, but I advised her to skip HC the am of the labs today, which she did - We'll continue the same dose - We'll recheck her TFTs today    3. Adrenal insufficiency and 4. Fatigue - Unclear etiology, but possibly 2/2 previous steroid use. 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 She has, however increased skin and mucosa pigmentation, which points towards primary adrenal insufficiency >> will continue to treat her as adrenal insufficiency and will maintain her on hydrocortisone. - She is now on HC 10 mg in am and 5 mg in pm, however, she feels tired and "achey" >> will increase the dose a little. Will also check for vitamin B12 and D def - Reiterated sick days rules - she is aware of them - advised for [place] bracelet mentioning "adrenal insufficiency". She does not have one yet.  Patient Instructions    Please try a higher dose of Hydrocortisone: 12.5 mg in am 7.5 mg in pm   - You absolutely need to take this medication every day and not skip doses. - Please double the dose if you have a fever, for the duration of the fever. - If you cannot take anything by mouth (vomiting) or you have severe diarrhea so that you eliminate the hydrocortisone pills in your stool, please make sure that you get the hydrocortisone in the vein instead - go to the nearest emergency department/urgent care or you may go to your PCPs office  - Please try to get a MedAlert bracelet or  pendant indicating: "Adrenal insufficiency".  Continue Levothyroxine 112 mcg daily.  Take the thyroid hormone every day, with water, at least  30 minutes before breakfast, separated by at least 4 hours from: - acid reflux medications - calcium - iron  Please continue: - Glipizide ER 10 mg in am - Glipizide 5 mg before dinner - Onglyza 5 mg before b'fast   Before b'fast Before dinner  Onglyza 5 mg x   Glipizide ER/XL 10 mg x   Glipizide 5 mg  x   Please return in 3 months with your sugar log.  - time spent with the patient: 40 min, of which >50% was spent in obtaining information about her symptoms, reviewing her previous labs, evaluations, and treatments, counseling her about her conditions (please see the discussed topics above), and developing a plan to further investigate and treat them.  Component     Latest Ref Rng & Units 08/02/2016  TSH     0.35 - 4.50 uIU/mL 2.45  T4,Free(Direct)     0.60 - 1.60 ng/dL 0.99  Vitamin B12     211 - 911 pg/mL 140 (L)  VITD     30.00 - 100.00 ng/mL 28.56 (L)  C-Peptide     0.80 - 3.85 ng/mL 11.87 (H)  Glucose, Fasting     65 - 99 mg/dL 173 (H)   She has good insulin production, no sign of DM1. TFTs are normal. However, vitamin B12 and vitamin D are low. I would suggest to start 2000 units vitamin D daily and B12 injections weekly x4 weeks, then monthly x 5 mo.  Philemon Kingdom, MD PhD [facility]

## 2016-08-03 LAB — GLUCOSE, FASTING: Glucose, Fasting: 173 mg/dL — ABNORMAL HIGH (ref 65–99)

## 2016-08-03 LAB — C-PEPTIDE: C-Peptide: 11.87 ng/mL — ABNORMAL HIGH (ref 0.80–3.85)

## 2016-08-09 ENCOUNTER — Ambulatory Visit (INDEPENDENT_AMBULATORY_CARE_PROVIDER_SITE_OTHER): Payer: BLUE CROSS/BLUE SHIELD

## 2016-08-09 DIAGNOSIS — E538 Deficiency of other specified B group vitamins: Secondary | ICD-10-CM | POA: Diagnosis not present

## 2016-08-09 MED ORDER — CYANOCOBALAMIN 1000 MCG/ML IJ SOLN
1000.0000 ug | Freq: Once | INTRAMUSCULAR | Status: AC
  Administered 2016-08-09: 1000 ug via INTRAMUSCULAR

## 2016-08-16 ENCOUNTER — Ambulatory Visit (INDEPENDENT_AMBULATORY_CARE_PROVIDER_SITE_OTHER): Payer: BLUE CROSS/BLUE SHIELD

## 2016-08-16 DIAGNOSIS — E538 Deficiency of other specified B group vitamins: Secondary | ICD-10-CM

## 2016-08-16 MED ORDER — CYANOCOBALAMIN 1000 MCG/ML IJ SOLN
1000.0000 ug | Freq: Once | INTRAMUSCULAR | Status: AC
  Administered 2016-08-16: 1000 ug via INTRAMUSCULAR

## 2016-08-17 ENCOUNTER — Encounter: Payer: Self-pay | Admitting: Vascular Surgery

## 2016-08-22 ENCOUNTER — Telehealth: Payer: Self-pay

## 2016-08-22 NOTE — Telephone Encounter (Signed)
[facility], [id number], [place], [place] [id number] on 08/22/2016. RLB

## 2016-08-23 ENCOUNTER — Ambulatory Visit (INDEPENDENT_AMBULATORY_CARE_PROVIDER_SITE_OTHER): Payer: BLUE CROSS/BLUE SHIELD

## 2016-08-23 DIAGNOSIS — E538 Deficiency of other specified B group vitamins: Secondary | ICD-10-CM | POA: Diagnosis not present

## 2016-08-23 MED ORDER — CYANOCOBALAMIN 1000 MCG/ML IJ SOLN
1000.0000 ug | Freq: Once | INTRAMUSCULAR | Status: AC
  Administered 2016-08-23: 1000 ug via INTRAMUSCULAR

## 2016-08-29 ENCOUNTER — Ambulatory Visit (INDEPENDENT_AMBULATORY_CARE_PROVIDER_SITE_OTHER): Payer: BLUE CROSS/BLUE SHIELD | Admitting: Vascular Surgery

## 2016-08-29 ENCOUNTER — Other Ambulatory Visit: Payer: Self-pay | Admitting: Family Medicine

## 2016-08-29 ENCOUNTER — Encounter: Payer: Self-pay | Admitting: Vascular Surgery

## 2016-08-29 ENCOUNTER — Ambulatory Visit (HOSPITAL_COMMUNITY)
Admission: RE | Admit: 2016-08-29 | Discharge: 2016-08-29 | Disposition: A | Discharge status: Home / Self Care | Payer: BLUE CROSS/BLUE SHIELD | Source: Ambulatory Visit | Attending: Vascular Surgery | Admitting: Vascular Surgery

## 2016-08-29 ENCOUNTER — Telehealth: Payer: Self-pay | Admitting: Family Medicine

## 2016-08-29 ENCOUNTER — Ambulatory Visit (INDEPENDENT_AMBULATORY_CARE_PROVIDER_SITE_OTHER)
Admission: RE | Admit: 2016-08-29 | Discharge: 2016-08-29 | Disposition: A | Discharge status: Home / Self Care | Payer: BLUE CROSS/BLUE SHIELD | Source: Ambulatory Visit | Attending: Family Medicine | Admitting: Family Medicine

## 2016-08-29 ENCOUNTER — Other Ambulatory Visit: Payer: Self-pay

## 2016-08-29 VITALS — BP 119/75 | HR 92 | Temp 98.0°F | Resp 18 | Ht 64.5 in | Wt 166.1 lb

## 2016-08-29 DIAGNOSIS — I739 Peripheral vascular disease, unspecified: Secondary | ICD-10-CM | POA: Diagnosis not present

## 2016-08-29 DIAGNOSIS — I6523 Occlusion and stenosis of bilateral carotid arteries: Secondary | ICD-10-CM | POA: Diagnosis not present

## 2016-08-29 DIAGNOSIS — R0989 Other specified symptoms and signs involving the circulatory and respiratory systems: Secondary | ICD-10-CM | POA: Diagnosis not present

## 2016-08-29 DIAGNOSIS — I70213 Atherosclerosis of native arteries of extremities with intermittent claudication, bilateral legs: Secondary | ICD-10-CM | POA: Insufficient documentation

## 2016-08-29 LAB — VAS US CAROTID
LCCAPSYS: 165 cm/s
LEFT ECA DIAS: -28 cm/s
Left CCA dist dias: 27 cm/s
Left CCA dist sys: 129 cm/s
Left CCA prox dias: 26 cm/s
Left ICA dist dias: -32 cm/s
Left ICA dist sys: -104 cm/s
Left ICA prox dias: 34 cm/s
Left ICA prox sys: 171 cm/s
RCCAPDIAS: 29 cm/s
RCCAPSYS: 161 cm/s
RIGHT CCA MID DIAS: 17 cm/s
RIGHT ECA DIAS: 61 cm/s
Right cca dist sys: -89 cm/s

## 2016-08-29 NOTE — Telephone Encounter (Signed)
Courtney Bonilla with [facility] called & states pt is there for carotid study & orders are in for arterial study but referral for [facility] & wanted to know if could do there.  Called Dr. Susann GivensLalonde, states ok, ordered was changed.

## 2016-08-29 NOTE — Progress Notes (Signed)
Patient name: Courtney Bonilla MRN: [id number] DOB: 12/26/1955 Sex: female  REASON FOR CONSULT: Carotid bruit. Referred by [facility]  HPI: Courtney Bonilla is a 61 y.o. female, who was found to have bilateral carotid bruits. She was therefore set up for vascular consultation. She is right-handed. She denies any history of stroke, TIAs, expressive or receptive aphasia, or amaurosis fugax.  In addition, she has a history of peripheral vascular disease. She had a stent placed in the left common femoral artery according to the patient several years ago in [place].  Her risk factors for peripheral vascular disease include diabetes, hypertension, hyperlipidemia, and continued tobacco use.  I have reviewed the records that were sent from the referring office. She has been followed with some shoulder pain which has been attributed to arthritis. He smokes 1 pack per day and has been smoking for 40 years. She was found to have bilateral carotid bruits which prompted this visit.  Past Medical History:  Diagnosis Date  . Addison disease (HCC) 2016  . Back pain   . Diabetes mellitus without complication (HCC)   . Hyperlipidemia   . Hypertension   . Thyroid disease   . UC (ulcerative colitis) (HCC) 2017    Family History  Problem Relation Age of Onset  . Diabetes Mother   . Hypertension Mother   . Thyroid disease Mother   . Hypertension Father   . Diabetes Sister   . Hypertension Sister   . Thyroid disease Sister   . Diabetes Brother   . Hypertension Brother   . Thyroid disease Brother     SOCIAL HISTORY: Social History   Social History  . Marital status: Widowed    Spouse name: N/A  . Number of children: 1  . Years of education: N/A   Occupational History  . retired    Social History Main Topics  . Smoking status: Current Every Day Smoker    Packs/day: 0.50    Years: 40.00    Types: Cigarettes  . Smokeless tobacco: Never Used  . Alcohol use No  . Drug use:  No  . Sexual activity: Not on file   Other Topics Concern  . Not on file   Social History Narrative  . No narrative on file    No Known Allergies  Current Outpatient Prescriptions  Medication Sig Dispense Refill  . amLODipine (NORVASC) 10 MG tablet Take 1 tablet (10 mg total) by mouth daily. 90 tablet 3  . cilostazol (PLETAL) 50 MG tablet Take 1 tablet (50 mg total) by mouth 2 (two) times daily. 60 tablet 5  . glipiZIDE (GLUCOTROL XL) 10 MG 24 hr tablet Take 1 tablet (10 mg total) by mouth daily with breakfast. 90 tablet 1  . glipiZIDE (GLUCOTROL) 5 MG tablet Take 1 tablet (5 mg total) by mouth daily before supper. 90 tablet 1  . hydrocortisone (CORTEF) 5 MG tablet Take 3 tablets in the morning & 1 tablet in the afternoon daily. (Patient taking differently: Take 2  tablets in the morning & 1 tablet in the afternoon daily.) 360 tablet 3  . levothyroxine (SYNTHROID, LEVOTHROID) 112 MCG tablet TAKE ONE TABLET BY MOUTH ONCE DAILY 60 tablet 1  . lisinopril-hydrochlorothiazide (ZESTORETIC) 20-12.5 MG tablet Take 1 tablet by mouth daily. 90 tablet 3  . saxagliptin HCl (ONGLYZA) 5 MG TABS tablet Take 1 tablet (5 mg total) by mouth daily. 90 tablet 0   No current facility-administered medications for this visit.     REVIEW  OF SYSTEMS:  [X]  denotes positive finding, [ ]  denotes negative finding Cardiac  Comments:  Chest pain or chest pressure:    Shortness of breath upon exertion:    Short of breath when lying flat:    Irregular heart rhythm:        Vascular    Pain in calf, thigh, or hip brought on by ambulation: X   Pain in feet at night that wakes you up from your sleep:  X   Blood clot in your veins:    Leg swelling:         Pulmonary    Oxygen at home:    Productive cough:     Wheezing:         Neurologic    Sudden weakness in arms or legs:     Sudden numbness in arms or legs:     Sudden onset of difficulty speaking or slurred speech:    Temporary loss of vision in one  eye:     Problems with dizziness:         Gastrointestinal    Blood in stool:     Vomited blood:         Genitourinary    Burning when urinating:     Blood in urine:        Psychiatric    Major depression:         Hematologic    Bleeding problems:    Problems with blood clotting too easily:        Skin    Rashes or ulcers:        Constitutional    Fever or chills:      PHYSICAL EXAM: Vitals:   08/29/16 1004 08/29/16 1006  BP: 126/72 119/75  Pulse: 92   Resp: 18   Temp: 98 F (36.7 C)   TempSrc: Oral   SpO2: 99%   Weight: 166 lb 1.6 oz (75.3 kg)   Height: 5' 4.5" (1.638 m)     GENERAL: The patient is a well-nourished female, in no acute distress. The vital signs are documented above. CARDIAC: There is a regular rate and rhythm.  VASCULAR: She has soft bilateral carotid bruits. On the left side, which is the symptomatic side, I cannot palpate a femoral pulse. I cannot palpate popliteal or pedal pulses. On the right side, she has a palpable femoral pulse and a palpable posterior tibial pulse. She has no significant lower extremity swelling. PULMONARY: There is good air exchange bilaterally without wheezing or rales. ABDOMEN: Soft and non-tender with normal pitched bowel sounds.  MUSCULOSKELETAL: There are no major deformities or cyanosis. NEUROLOGIC: No focal weakness or paresthesias are detected. SKIN: There are no ulcers or rashes noted. PSYCHIATRIC: The patient has a normal affect.  DATA:   CAROTID DUPLEX: I have independently interpreted her carotid duplex scan today. She has a less than 39% carotid stenosis bilaterally. 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  BILATERAL LOWER EXTREMITY ARTERIAL DOPPLER STUDY: I have independent interpreted her bilateral lower extremity arterial Doppler study.  On the left side she has a triphasic dorsalis pedis signal and triphasic posterior tibial signal with an ABI of  98%. Toe pressure is 111 mmHg.  On the right side there is a monophasic posterior tibial signal. There is no dorsalis pedis signal. ABI is 64% on the left with a toe pressure of 70.  MEDICAL ISSUES:  BILATERAL CAROTID BRUITS: Her bilateral carotid bruits are secondary  to stenoses in the external carotid arteries. She has no significant disease in her internal carotid arteries. We have discussed the importance of tobacco cessation. I have explained that the vascular literature suggests that patients with vascular disease have lower mortality and risk of heart attack and stroke if they are on both aspirin and a low-dose statin. She will discuss this with her primary care physician.  PERIPHERAL VASCULAR DISEASE: The patient has known peripheral vascular disease and has had a previous stent placed in the left leg in [place]. We discussed conservative treatment. I explained the importance of tobacco cessation and also encouraged her to stay on a structured walking program. We have also discussed consideration for aspirin and a low-dose statin. She feels her symptoms are significantly disabling and therefore like to proceed with arteriography.  I have reviewed with the patient the indications for arteriography. In addition, I have reviewed the potential complications of arteriography including but not limited to: Bleeding, arterial injury, arterial thrombosis, dye action, renal insufficiency, or other unpredictable medical problems. I have explained to the patient that if we find disease amenable to angioplasty we could potentially address this at the same time. I have discussed the potential complications of angioplasty and stenting, including but not limited to: Bleeding, arterial thrombosis, arterial injury, dissection, or the need for surgical intervention. Her procedure is scheduled for 09/10/2016. I'll make further recommendations pending these results.  Waverly Ferrari [facility] [phone]

## 2016-08-30 ENCOUNTER — Ambulatory Visit (INDEPENDENT_AMBULATORY_CARE_PROVIDER_SITE_OTHER): Payer: BLUE CROSS/BLUE SHIELD

## 2016-08-30 DIAGNOSIS — E538 Deficiency of other specified B group vitamins: Secondary | ICD-10-CM | POA: Diagnosis not present

## 2016-08-30 MED ORDER — CYANOCOBALAMIN 1000 MCG/ML IJ SOLN
1000.0000 ug | Freq: Once | INTRAMUSCULAR | Status: AC
  Administered 2016-08-30: 1000 ug via INTRAMUSCULAR

## 2016-09-10 ENCOUNTER — Encounter (HOSPITAL_COMMUNITY)
Admission: RE | Disposition: A | Discharge status: Home / Self Care | Payer: Self-pay | Source: Ambulatory Visit | Attending: Vascular Surgery

## 2016-09-10 ENCOUNTER — Ambulatory Visit (HOSPITAL_COMMUNITY)
Admission: RE | Admit: 2016-09-10 | Discharge: 2016-09-10 | Disposition: A | Discharge status: Home / Self Care | Payer: BLUE CROSS/BLUE SHIELD | Source: Ambulatory Visit | Attending: Vascular Surgery | Admitting: Vascular Surgery

## 2016-09-10 ENCOUNTER — Encounter (HOSPITAL_COMMUNITY): Payer: Self-pay | Admitting: Vascular Surgery

## 2016-09-10 ENCOUNTER — Telehealth: Payer: Self-pay | Admitting: *Deleted

## 2016-09-10 DIAGNOSIS — E1151 Type 2 diabetes mellitus with diabetic peripheral angiopathy without gangrene: Secondary | ICD-10-CM | POA: Insufficient documentation

## 2016-09-10 DIAGNOSIS — Y831 Surgical operation with implant of artificial internal device as the cause of abnormal reaction of the patient, or of later complication, without mention of misadventure at the time of the procedure: Secondary | ICD-10-CM | POA: Insufficient documentation

## 2016-09-10 DIAGNOSIS — T82856A Stenosis of peripheral vascular stent, initial encounter: Secondary | ICD-10-CM | POA: Insufficient documentation

## 2016-09-10 DIAGNOSIS — I70212 Atherosclerosis of native arteries of extremities with intermittent claudication, left leg: Secondary | ICD-10-CM | POA: Insufficient documentation

## 2016-09-10 DIAGNOSIS — E785 Hyperlipidemia, unspecified: Secondary | ICD-10-CM | POA: Insufficient documentation

## 2016-09-10 DIAGNOSIS — M549 Dorsalgia, unspecified: Secondary | ICD-10-CM | POA: Diagnosis not present

## 2016-09-10 DIAGNOSIS — I10 Essential (primary) hypertension: Secondary | ICD-10-CM | POA: Insufficient documentation

## 2016-09-10 DIAGNOSIS — Z7984 Long term (current) use of oral hypoglycemic drugs: Secondary | ICD-10-CM | POA: Diagnosis not present

## 2016-09-10 DIAGNOSIS — E079 Disorder of thyroid, unspecified: Secondary | ICD-10-CM | POA: Insufficient documentation

## 2016-09-10 DIAGNOSIS — I70201 Unspecified atherosclerosis of native arteries of extremities, right leg: Secondary | ICD-10-CM | POA: Diagnosis not present

## 2016-09-10 DIAGNOSIS — F17210 Nicotine dependence, cigarettes, uncomplicated: Secondary | ICD-10-CM | POA: Diagnosis not present

## 2016-09-10 HISTORY — PX: ABDOMINAL AORTOGRAM W/LOWER EXTREMITY: CATH118223

## 2016-09-10 LAB — POCT I-STAT, CHEM 8
BUN: 36 mg/dL — ABNORMAL HIGH (ref 6–20)
Calcium, Ion: 1.24 mmol/L (ref 1.15–1.40)
Chloride: 107 mmol/L (ref 101–111)
Creatinine, Ser: 2.7 mg/dL — ABNORMAL HIGH (ref 0.44–1.00)
Glucose, Bld: 165 mg/dL — ABNORMAL HIGH (ref 65–99)
HEMATOCRIT: 36 % (ref 36.0–46.0)
Hemoglobin: 12.2 g/dL (ref 12.0–15.0)
Potassium: 4.1 mmol/L (ref 3.5–5.1)
SODIUM: 139 mmol/L (ref 135–145)
TCO2: 22 mmol/L (ref 0–100)

## 2016-09-10 LAB — GLUCOSE, CAPILLARY
Glucose-Capillary: 162 mg/dL — ABNORMAL HIGH (ref 65–99)
Glucose-Capillary: 157 mg/dL — ABNORMAL HIGH (ref 65–99)

## 2016-09-10 SURGERY — ABDOMINAL AORTOGRAM W/LOWER EXTREMITY
Anesthesia: LOCAL

## 2016-09-10 MED ORDER — ACETAMINOPHEN 325 MG PO TABS
ORAL_TABLET | ORAL | Status: AC
  Administered 2016-09-10: 650 mg
  Filled 2016-09-10: qty 2

## 2016-09-10 MED ORDER — HEPARIN (PORCINE) IN NACL 2-0.9 UNIT/ML-% IJ SOLN
INTRAMUSCULAR | Status: AC
  Filled 2016-09-10: qty 1000

## 2016-09-10 MED ORDER — LIDOCAINE HCL (PF) 1 % IJ SOLN
INTRAMUSCULAR | Status: DC | PRN
  Administered 2016-09-10: 16 mL via SUBCUTANEOUS

## 2016-09-10 MED ORDER — FENTANYL CITRATE (PF) 100 MCG/2ML IJ SOLN
INTRAMUSCULAR | Status: AC
  Filled 2016-09-10: qty 2

## 2016-09-10 MED ORDER — LIDOCAINE HCL (PF) 1 % IJ SOLN
INTRAMUSCULAR | Status: AC
  Filled 2016-09-10: qty 30

## 2016-09-10 MED ORDER — MIDAZOLAM HCL 2 MG/2ML IJ SOLN
INTRAMUSCULAR | Status: AC
  Filled 2016-09-10: qty 2

## 2016-09-10 MED ORDER — MIDAZOLAM HCL 2 MG/2ML IJ SOLN
INTRAMUSCULAR | Status: DC | PRN
  Administered 2016-09-10: 1 mg via INTRAVENOUS

## 2016-09-10 MED ORDER — HEPARIN (PORCINE) IN NACL 2-0.9 UNIT/ML-% IJ SOLN
INTRAMUSCULAR | Status: DC | PRN
  Administered 2016-09-10: 1000 mL via INTRA_ARTERIAL

## 2016-09-10 MED ORDER — ACETAMINOPHEN 325 MG PO TABS
650.0000 mg | ORAL_TABLET | Freq: Once | ORAL | Status: DC
  Filled 2016-09-10: qty 2

## 2016-09-10 MED ORDER — HYDRALAZINE HCL 20 MG/ML IJ SOLN: 10.0000 mg | INTRAMUSCULAR | Status: DC | PRN

## 2016-09-10 MED ORDER — FENTANYL CITRATE (PF) 100 MCG/2ML IJ SOLN
INTRAMUSCULAR | Status: DC | PRN
  Administered 2016-09-10: 50 ug via INTRAVENOUS

## 2016-09-10 MED ORDER — SODIUM CHLORIDE 0.9 % IV SOLN: 1.0000 mL/kg/h | INTRAVENOUS | Status: DC

## 2016-09-10 MED ORDER — SODIUM CHLORIDE 0.9 % IV SOLN
INTRAVENOUS | Status: DC
  Administered 2016-09-10: 07:00:00 via INTRAVENOUS

## 2016-09-10 SURGICAL SUPPLY — 13 items
CATH ANGIO 5F PIGTAIL 65CM (CATHETERS) ×2 IMPLANT
CATH CROSS OVER TEMPO 5F (CATHETERS) ×2 IMPLANT
CATH STRAIGHT 5FR 65CM (CATHETERS) ×2 IMPLANT
COVER PRB 48X5XTLSCP FOLD TPE (BAG) ×1 IMPLANT
COVER PROBE 5X48 (BAG) ×1
KIT PV (KITS) ×2 IMPLANT
RESERVOIR CO2 (VASCULAR PRODUCTS) ×2 IMPLANT
SET FLUSH CO2 (MISCELLANEOUS) ×2 IMPLANT
SHEATH PINNACLE 5F 10CM (SHEATH) ×2 IMPLANT
TRANSDUCER W/STOPCOCK (MISCELLANEOUS) ×2 IMPLANT
TRAY PV CATH (CUSTOM PROCEDURE TRAY) ×2 IMPLANT
WIRE HITORQ VERSACORE ST 145CM (WIRE) ×4 IMPLANT
WIRE MINI STICK MAX (SHEATH) ×2 IMPLANT

## 2016-09-10 NOTE — Telephone Encounter (Signed)
-----   Message from Chuck Hinthristopher S Dickson, MD sent at 09/10/2016  8:37 AM EDT ----- Regarding: charge and f/u PROCEDURE:  1. Ultrasound-guided access to the right common femoral artery 2. CO2 aortogram and bilateral iliac arteriogram 3. Selective catheterization of the left common iliac artery with left lower extremity runoff 4. Retrograde right femoral arteriogram with right lower extremity runoff 5. Conscious sedation  CLINICAL NOTE: I do not think that the patient is an ideal candidate for right to left femorofemoral bypass grafting given that there is a stent in the left common femoral artery and that the patient has some superficial femoral artery disease on the right. In addition there is some mild inflow disease on the right. Given that she simply claudication certainly I would not recommend aortobifemoral bypass grafting at this point. I think she is too young for axillobifemoral bypass grafting. For all these reasons I would recommend an aggressive conservative approach for now. I'll see her back in 3 months with follow up ABIs at that time. She knows to call sooner if she has problems.  Waverly Ferrarihristopher Dickson, MD, FACS [facility]  DATE OF DICTATION:   09/10/2016

## 2016-09-10 NOTE — H&P (View-Only) (Signed)
Patient name: Courtney MayotteSheryl Bonilla MRN: [id number] DOB: 12/26/1955 Sex: female  REASON FOR CONSULT: Carotid bruit. Referred by [facility]  HPI: Courtney MayotteSheryl Bonilla is a 61 y.o. female, who was found to have bilateral carotid bruits. She was therefore set up for vascular consultation. She is right-handed. She denies any history of stroke, TIAs, expressive or receptive aphasia, or amaurosis fugax.  In addition, she has a history of peripheral vascular disease. She had a stent placed in the left common femoral artery according to the patient several years ago in [place].  Her risk factors for peripheral vascular disease include diabetes, hypertension, hyperlipidemia, and continued tobacco use.  I have reviewed the records that were sent from the referring office. She has been followed with some shoulder pain which has been attributed to arthritis. He smokes 1 pack per day and has been smoking for 40 years. She was found to have bilateral carotid bruits which prompted this visit.  Past Medical History:  Diagnosis Date  . Addison disease (HCC) 2016  . Back pain   . Diabetes mellitus without complication (HCC)   . Hyperlipidemia   . Hypertension   . Thyroid disease   . UC (ulcerative colitis) (HCC) 2017    Family History  Problem Relation Age of Onset  . Diabetes Mother   . Hypertension Mother   . Thyroid disease Mother   . Hypertension Father   . Diabetes Sister   . Hypertension Sister   . Thyroid disease Sister   . Diabetes Brother   . Hypertension Brother   . Thyroid disease Brother     SOCIAL HISTORY: Social History   Social History  . Marital status: Widowed    Spouse name: N/A  . Number of children: 1  . Years of education: N/A   Occupational History  . retired    Social History Main Topics  . Smoking status: Current Every Day Smoker    Packs/day: 0.50    Years: 40.00    Types: Cigarettes  . Smokeless tobacco: Never Used  . Alcohol use No  . Drug use:  No  . Sexual activity: Not on file   Other Topics Concern  . Not on file   Social History Narrative  . No narrative on file    No Known Allergies  Current Outpatient Prescriptions  Medication Sig Dispense Refill  . amLODipine (NORVASC) 10 MG tablet Take 1 tablet (10 mg total) by mouth daily. 90 tablet 3  . cilostazol (PLETAL) 50 MG tablet Take 1 tablet (50 mg total) by mouth 2 (two) times daily. 60 tablet 5  . glipiZIDE (GLUCOTROL XL) 10 MG 24 hr tablet Take 1 tablet (10 mg total) by mouth daily with breakfast. 90 tablet 1  . glipiZIDE (GLUCOTROL) 5 MG tablet Take 1 tablet (5 mg total) by mouth daily before supper. 90 tablet 1  . hydrocortisone (CORTEF) 5 MG tablet Take 3 tablets in the morning & 1 tablet in the afternoon daily. (Patient taking differently: Take 2  tablets in the morning & 1 tablet in the afternoon daily.) 360 tablet 3  . levothyroxine (SYNTHROID, LEVOTHROID) 112 MCG tablet TAKE ONE TABLET BY MOUTH ONCE DAILY 60 tablet 1  . lisinopril-hydrochlorothiazide (ZESTORETIC) 20-12.5 MG tablet Take 1 tablet by mouth daily. 90 tablet 3  . saxagliptin HCl (ONGLYZA) 5 MG TABS tablet Take 1 tablet (5 mg total) by mouth daily. 90 tablet 0   No current facility-administered medications for this visit.     REVIEW  OF SYSTEMS:  [X]  denotes positive finding, [ ]  denotes negative finding Cardiac  Comments:  Chest pain or chest pressure:    Shortness of breath upon exertion:    Short of breath when lying flat:    Irregular heart rhythm:        Vascular    Pain in calf, thigh, or hip brought on by ambulation: X   Pain in feet at night that wakes you up from your sleep:  X   Blood clot in your veins:    Leg swelling:         Pulmonary    Oxygen at home:    Productive cough:     Wheezing:         Neurologic    Sudden weakness in arms or legs:     Sudden numbness in arms or legs:     Sudden onset of difficulty speaking or slurred speech:    Temporary loss of vision in one  eye:     Problems with dizziness:         Gastrointestinal    Blood in stool:     Vomited blood:         Genitourinary    Burning when urinating:     Blood in urine:        Psychiatric    Major depression:         Hematologic    Bleeding problems:    Problems with blood clotting too easily:        Skin    Rashes or ulcers:        Constitutional    Fever or chills:      PHYSICAL EXAM: Vitals:   08/29/16 1004 08/29/16 1006  BP: 126/72 119/75  Pulse: 92   Resp: 18   Temp: 98 F (36.7 C)   TempSrc: Oral   SpO2: 99%   Weight: 166 lb 1.6 oz (75.3 kg)   Height: 5' 4.5" (1.638 m)     GENERAL: The patient is a well-nourished female, in no acute distress. The vital signs are documented above. CARDIAC: There is a regular rate and rhythm.  VASCULAR: She has soft bilateral carotid bruits. On the left side, which is the symptomatic side, I cannot palpate a femoral pulse. I cannot palpate popliteal or pedal pulses. On the right side, she has a palpable femoral pulse and a palpable posterior tibial pulse. She has no significant lower extremity swelling. PULMONARY: There is good air exchange bilaterally without wheezing or rales. ABDOMEN: Soft and non-tender with normal pitched bowel sounds.  MUSCULOSKELETAL: There are no major deformities or cyanosis. NEUROLOGIC: No focal weakness or paresthesias are detected. SKIN: There are no ulcers or rashes noted. PSYCHIATRIC: The patient has a normal affect.  DATA:   CAROTID DUPLEX: I have independently interpreted her carotid duplex scan today. She has a less than 39% carotid stenosis bilaterally. 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  BILATERAL LOWER EXTREMITY ARTERIAL DOPPLER STUDY: I have independent interpreted her bilateral lower extremity arterial Doppler study.  On the left side she has a triphasic dorsalis pedis signal and triphasic posterior tibial signal with an ABI of  98%. Toe pressure is 111 mmHg.  On the right side there is a monophasic posterior tibial signal. There is no dorsalis pedis signal. ABI is 64% on the left with a toe pressure of 70.  MEDICAL ISSUES:  BILATERAL CAROTID BRUITS: Her bilateral carotid bruits are secondary  to stenoses in the external carotid arteries. She has no significant disease in her internal carotid arteries. We have discussed the importance of tobacco cessation. I have explained that the vascular literature suggests that patients with vascular disease have lower mortality and risk of heart attack and stroke if they are on both aspirin and a low-dose statin. She will discuss this with her primary care physician.  PERIPHERAL VASCULAR DISEASE: The patient has known peripheral vascular disease and has had a previous stent placed in the left leg in [place]. We discussed conservative treatment. I explained the importance of tobacco cessation and also encouraged her to stay on a structured walking program. We have also discussed consideration for aspirin and a low-dose statin. She feels her symptoms are significantly disabling and therefore like to proceed with arteriography.  I have reviewed with the patient the indications for arteriography. In addition, I have reviewed the potential complications of arteriography including but not limited to: Bleeding, arterial injury, arterial thrombosis, dye action, renal insufficiency, or other unpredictable medical problems. I have explained to the patient that if we find disease amenable to angioplasty we could potentially address this at the same time. I have discussed the potential complications of angioplasty and stenting, including but not limited to: Bleeding, arterial thrombosis, arterial injury, dissection, or the need for surgical intervention. Her procedure is scheduled for 09/10/2016. I'll make further recommendations pending these results.  Waverly Ferrari [facility] [phone]

## 2016-09-10 NOTE — Discharge Instructions (Signed)
Angiogram, Care After °This sheet gives you information about how to care for yourself after your procedure. Your health care provider may also give you more specific instructions. If you have problems or questions, contact your health care provider. °What can I expect after the procedure? °After the procedure, it is common to have bruising and tenderness at the catheter insertion area. °Follow these instructions at home: °Insertion site care  °· Follow instructions from your health care provider about how to take care of your insertion site. Make sure you: °¨ Wash your hands with soap and water before you change your bandage (dressing). If soap and water are not available, use hand sanitizer. °¨ Change your dressing as told by your health care provider. °¨ Leave stitches (sutures), skin glue, or adhesive strips in place. These skin closures may need to stay in place for 2 weeks or longer. If adhesive strip edges start to loosen and curl up, you may trim the loose edges. Do not remove adhesive strips completely unless your health care provider tells you to do that. °· Do not take baths, swim, or use a hot tub until your health care provider approves. °· You may shower 24-48 hours after the procedure or as told by your health care provider. °¨ Gently wash the site with plain soap and water. °¨ Pat the area dry with a clean towel. °¨ Do not rub the site. This may cause bleeding. °· Do not apply powder or lotion to the site. Keep the site clean and dry. °· Check your insertion site every day for signs of infection. Check for: °¨ Redness, swelling, or pain. °¨ Fluid or blood. °¨ Warmth. °¨ Pus or a bad smell. °Activity  °· Rest as told by your health care provider, usually for 1-2 days. °· Do not lift anything that is heavier than 10 lbs. (4.5 kg) or as told by your health care provider. °· Do not drive for 24 hours if you were given a medicine to help you relax (sedative). °· Do not drive or use heavy machinery while  taking prescription pain medicine. °General instructions  °· Return to your normal activities as told by your health care provider, usually in about a week. Ask your health care provider what activities are safe for you. °· If the catheter site starts bleeding, lie flat and put pressure on the site. If the bleeding does not stop, get help right away. This is a medical emergency. °· Drink enough fluid to keep your urine clear or pale yellow. This helps flush the contrast dye from your body. °· Take over-the-counter and prescription medicines only as told by your health care provider. °· Keep all follow-up visits as told by your health care provider. This is important. °Contact a health care provider if: °· You have a fever or chills. °· You have redness, swelling, or pain around your insertion site. °· You have fluid or blood coming from your insertion site. °· The insertion site feels warm to the touch. °· You have pus or a bad smell coming from your insertion site. °· You have bruising around the insertion site. °· You notice blood collecting in the tissue around the catheter site (hematoma). The hematoma may be painful to the touch. °Get help right away if: °· You have severe pain at the catheter insertion area. °· The catheter insertion area swells very fast. °· The catheter insertion area is bleeding, and the bleeding does not stop when you hold steady pressure on   the area. °· The area near or just beyond the catheter insertion site becomes pale, cool, tingly, or numb. °These symptoms may represent a serious problem that is an emergency. Do not wait to see if the symptoms will go away. Get medical help right away. Call your local emergency services (911 in the [place] Do not drive yourself to the hospital. °Summary °· After the procedure, it is common to have bruising and tenderness at the catheter insertion area. °· After the procedure, it is important to rest and drink plenty of fluids. °· Do not take baths,  swim, or use a hot tub until your health care provider says it is okay to do so. You may shower 24-48 hours after the procedure or as told by your health care provider. °· If the catheter site starts bleeding, lie flat and put pressure on the site. If the bleeding does not stop, get help right away. This is a medical emergency. °This information is not intended to replace advice given to you by your health care provider. Make sure you discuss any questions you have with your health care provider. °Document Released: 12/21/2004 Document Revised: 05/09/2016 Document Reviewed: 05/09/2016 °[employer]vier Interactive Patient Education © 2017 [employer] ° °

## 2016-09-10 NOTE — Op Note (Signed)
Courtney Bonilla: Courtney Bonilla   MRN: [id number] DOB: 07/12/1955    DATE OF PROCEDURE: 09/10/2016  INDICATIONS: Courtney MayotteSheryl Agostino is a 61 y.o. female with claudication of the left lower extremity. She had a previous left common femoral artery stent in [place]. We discussed conservative therapy, however she felt her symptoms were significantly disabling and wished to pursue arteriography. She had a creatinine of 2.7 and therefore elected to do the procedure with CO2 only.  PROCEDURE:  1. Ultrasound-guided access to the right common femoral artery 2. CO2 aortogram and bilateral iliac arteriogram 3. Selective catheterization of the left common iliac artery with left lower extremity runoff 4. Retrograde right femoral arteriogram with right lower extremity runoff 5. Conscious sedation  SURGEON: Di KindleChristopher S. Edilia Boickson, MD, FACS  ANESTHESIA: Local with sedation   EBL: Minimal  TECHNIQUE: The patient was brought to the peripheral vascular lab and was sedated. The period of conscious sedation was 40 minutes.  During that time period, I was present face-to-face 100% of the time.  The patient was administered 1 mg of Versed and 50 g of fentanyl. The patient's heart rate, blood pressure, and oxygen saturation were monitored by the nurse continuously during the procedure.  Both groins were prepped and draped in usual sterile fashion. Under ultrasound guidance, after the skin was anesthetized, the right common femoral artery was cannulated with a micropuncture needle and a micropuncture sheath introduced over a wire. This was exchanged for a 5 [place] sheath over a [employer] wire. The pigtail catheter was positioned at the L1 vertebral body and flush aortogram obtained. The catheter was positioned above the aortic bifurcation and oblique iliac projections were obtained.  Next I exchanged the pigtail catheter for a crossover catheter which was positioned into the left common iliac artery. The wire was advanced into the  hypogastric artery and the crossover catheter exchanged for a straight catheter. This was positioned in the left common iliac artery and left lower extremity runoff obtained using CO2.  This catheter was then removed and then a retrograde right femoral arterial was obtained with CO2 with right lower extremity runoff. At the completion of the procedure, the patient was transferred to the holding area for removal of the sheath.  FINDINGS:  1. The right renal artery was not identified. The left renal artery is widely patent. The infrarenal aorta is widely patent. 2. On the left side, which is the side of concern, the common iliac artery is patent as is the hypogastric artery. The left external iliac artery is occluded. The common femoral artery where she has had a previous stent is occluded. There is reconstitution of the proximal superficial femoral artery and deep femoral artery. The superficial femoral artery, popliteal, anterior tibial, tibial peroneal trunk, peroneal, posterior tibial arteries are patent. There is poor visualization distally because of the CO2. 3. On the right side the common iliac and external iliac arteries have mild diffuse disease. The common femoral, deep femoral and superficial femoral arteries are patent. There is a moderate stenosis at the adductor canal on the right in the common femoral artery. The popliteal artery is patent. The anterior tibial, tibial peroneal trunk, posterior tibial, arteries are patent. There is poor visualization distally.  CLINICAL NOTE: I do not think that the patient is an ideal candidate for right to left femorofemoral bypass grafting given that there is a stent in the left common femoral artery and that the patient has some superficial femoral artery disease on the right. In addition there is some  mild inflow disease on the right. Given that she simply claudication certainly I would not recommend aortobifemoral bypass grafting at this point. I think  she is too young for axillobifemoral bypass grafting. For all these reasons I would recommend an aggressive conservative approach for now. I'll see her back in 3 months with follow up ABIs at that time. She knows to call sooner if she has problems.  Waverly Ferrari, MD, FACS [facility]  DATE OF DICTATION:   09/10/2016

## 2016-09-10 NOTE — Interval H&P Note (Signed)
History and Physical Interval Note:  09/10/2016 7:33 AM  Courtney Bonilla  has presented today for surgery, with the diagnosis of pvd  The various methods of treatment have been discussed with the patient and family. After consideration of risks, benefits and other options for treatment, the patient has consented to  Procedure(s): Abdominal Aortogram w/Lower Extremity (N/A) as a surgical intervention .  The patient's history has been reviewed, patient examined, no change in status, stable for surgery.  I have reviewed the patient's chart and labs.  Questions were answered to the patient's satisfaction.     Waverly Ferrariickson, Mikki Ziff

## 2016-09-10 NOTE — Telephone Encounter (Signed)
Note transferred from staff message.

## 2016-09-10 NOTE — Progress Notes (Signed)
Site area: Right groin a 5 french arterial sheath was removed  Site Prior to Removal:  Level 0  Pressure Applied For 15 MINUTES    Bedrest Beginning at 0900am  Manual:   Yes.    Patient Status During Pull:  stable  Post Pull Groin Site:  Level 0  Post Pull Instructions Given:  Yes.    Post Pull Pulses Present:  Yes.    Dressing Applied:  Yes.    Comments:  VS remain stable

## 2016-09-11 ENCOUNTER — Telehealth: Payer: Self-pay | Admitting: Vascular Surgery

## 2016-09-11 NOTE — Telephone Encounter (Signed)
Spoke to pt for appt on 6/27, verified address and mailed letter

## 2016-09-11 NOTE — Telephone Encounter (Signed)
-----   Message from Sharee PimpleMarilyn K McChesney, RN sent at 09/10/2016  3:34 PM EDT ----- Regarding: schedule 3 months w/ ABI    ----- Message ----- From: Chuck Hinthristopher S Dickson, MD Sent: 09/10/2016   8:37 AM To: [facility] Charge Pool Subject: charge and f/u                                 PROCEDURE:  1. Ultrasound-guided access to the right common femoral artery 2. CO2 aortogram and bilateral iliac arteriogram 3. Selective catheterization of the left common iliac artery with left lower extremity runoff 4. Retrograde right femoral arteriogram with right lower extremity runoff 5. Conscious sedation  CLINICAL NOTE: I do not think that the patient is an ideal candidate for right to left femorofemoral bypass grafting given that there is a stent in the left common femoral artery and that the patient has some superficial femoral artery disease on the right. In addition there is some mild inflow disease on the right. Given that she simply claudication certainly I would not recommend aortobifemoral bypass grafting at this point. I think she is too young for axillobifemoral bypass grafting. For all these reasons I would recommend an aggressive conservative approach for now. I'll see her back in 3 months with follow up ABIs at that time. She knows to call sooner if she has problems.  Waverly Ferrarihristopher Dickson, MD, FACS [facility]  DATE OF DICTATION:   09/10/2016

## 2016-09-27 ENCOUNTER — Encounter: Payer: Self-pay | Admitting: Vascular Surgery

## 2016-10-02 ENCOUNTER — Ambulatory Visit (INDEPENDENT_AMBULATORY_CARE_PROVIDER_SITE_OTHER): Payer: BLUE CROSS/BLUE SHIELD

## 2016-10-02 DIAGNOSIS — E538 Deficiency of other specified B group vitamins: Secondary | ICD-10-CM

## 2016-10-02 MED ORDER — CYANOCOBALAMIN 1000 MCG/ML IJ SOLN
1000.0000 ug | Freq: Once | INTRAMUSCULAR | Status: AC
  Administered 2016-10-02: 1000 ug via INTRAMUSCULAR

## 2016-10-23 ENCOUNTER — Other Ambulatory Visit: Payer: Self-pay | Admitting: Internal Medicine

## 2016-10-30 ENCOUNTER — Ambulatory Visit (INDEPENDENT_AMBULATORY_CARE_PROVIDER_SITE_OTHER): Payer: BLUE CROSS/BLUE SHIELD | Admitting: Internal Medicine

## 2016-10-30 ENCOUNTER — Ambulatory Visit: Payer: BLUE CROSS/BLUE SHIELD

## 2016-10-30 ENCOUNTER — Encounter: Payer: Self-pay | Admitting: Internal Medicine

## 2016-10-30 VITALS — BP 128/72 | HR 105 | Ht 64.0 in | Wt 161.0 lb

## 2016-10-30 DIAGNOSIS — E1165 Type 2 diabetes mellitus with hyperglycemia: Secondary | ICD-10-CM | POA: Diagnosis not present

## 2016-10-30 DIAGNOSIS — E538 Deficiency of other specified B group vitamins: Secondary | ICD-10-CM

## 2016-10-30 DIAGNOSIS — E2740 Unspecified adrenocortical insufficiency: Secondary | ICD-10-CM | POA: Diagnosis not present

## 2016-10-30 DIAGNOSIS — E039 Hypothyroidism, unspecified: Secondary | ICD-10-CM | POA: Diagnosis not present

## 2016-10-30 DIAGNOSIS — E559 Vitamin D deficiency, unspecified: Secondary | ICD-10-CM

## 2016-10-30 LAB — POCT GLYCOSYLATED HEMOGLOBIN (HGB A1C): Hemoglobin A1C: 6.7

## 2016-10-30 MED ORDER — CYANOCOBALAMIN 1000 MCG/ML IJ SOLN
1000.0000 ug | Freq: Once | INTRAMUSCULAR | Status: AC
  Administered 2016-10-30: 1000 ug via INTRAMUSCULAR

## 2016-10-30 NOTE — Patient Instructions (Addendum)
Please continue Hydrocortisone, but try to decrease the dose to: 10 mg in am 7.5 mg in pm   - You absolutely need to take this medication every day and not skip doses. - Please double the dose if you have a fever, for the duration of the fever. - If you cannot take anything by mouth (vomiting) or you have severe diarrhea so that you eliminate the hydrocortisone pills in your stool, please make sure that you get the hydrocortisone in the vein instead - go to the nearest emergency department/urgent care or you may go to your PCPs office  - Please try to get a MedAlert bracelet or pendant indicating: "Adrenal insufficiency".  Continue Levothyroxine 112 mcg daily.  Take the thyroid hormone every day, with water, at least 30 minutes before breakfast, separated by at least 4 hours from: - acid reflux medications - calcium - iron  Please continue: - Glipizide ER 10 mg in am - Glipizide 5 mg before dinner - Onglyza 5 mg but move before lunch   Before b'fast Before lunch Before dinner  Onglyza 5 mg  x   Glipizide ER/XL 10 mg x    Glipizide 5 mg   x   Please return in 3 months with your sugar log.

## 2016-10-30 NOTE — Addendum Note (Signed)
Addended by: Darene LamerHOMPSON, Laurene Melendrez T on: 10/30/2016 09:37 AM   Modules accepted: Orders

## 2016-10-30 NOTE — Progress Notes (Signed)
Patient ID: Courtney Bonilla, female   DOB: 04-Dec-1955, 61 y.o.   MRN: [id number]   HPI: Courtney Bonilla is a 61 y.o.-year-old female, returning for f/u for DM2, dx in 2014, non-insulin-dependent, uncontrolled, without complications, also hypothyroidism, Adrenal insufficiency, dx 01/2015. Last OV 3 mo ago. She saw endocrinologist in [place]: Dr. Joan Bonilla  She saw vascular surgeon >> has claudication >> vascular blockages, unstentable. On Pletal.  Reviewed Hemoglobin A1c levels Lab Results  Component Value Date   HGBA1C 8.3 05/21/2016   HGBA1C 9.2 03/16/2016   09/29/2015: HbA1c 6.9%  Pt is on a regimen of:  Before b'fast Before dinner  Onglyza 5 mg x   Glipizide ER/XL 10 mg x   Glipizide 5 mg  x   Insurance did not approve Januvia. Metformin caused nausea. She also has CKD.  Pt checks her sugars 1-2x a day - no log, no meter: - am: 82, 111-152 >> 150-155 >> 89-90s >> 80s-90s - 2h after b'fast: n/c - before lunch: n/c >> 202 >> 130s >> 120s - 2h after lunch: n/c >> 250 >> n/c >> 150s - before dinner: n/c - 2h after dinner: n/c >> 200s, 311 >> 170-192 >> 170-217, but not checking regularly. - bedtime: n/c - nighttime: n/c No lows. Lowest sugar was 87 >> 89 >> 80s; ? hypoglycemia awareness Highest sugar was 260 >> 192 >> 217.  Glucometer: ReliOn  Pt's meals are: - Breakfast: usually skips, but sometimes egg + toast - Lunch: sandwich - Dinner: meat + veggies - Snacks: 2: cheese + crackers, PB + crackers, candy bars after dinner, potato chips  - + CKD, last BUN/creatinine higher (was taking too much Ibuprofen):  Lab Results  Component Value Date   BUN 36 (H) 09/10/2016   BUN 20 07/16/2016   CREATININE 2.70 (H) 09/10/2016   CREATININE 1.78 (H) 07/16/2016  09/29/2015: 15/1.77, EGFR 51, ACR 84.6 She is on lisinopril 20 mg daily. - last set of lipids: 09/29/2015: 274/534/32/134 No results found for: CHOL, HDL, LDLCALC, LDLDIRECT, TRIG, CHOLHDL  She is not on a  statin. - last eye exam was in 01/2016. No DR.  - no numbness and tingling in her feet. She has PVD >> has mm pain (claudication) in L leg - has a h/o stent.  Hypothyroidism:  She is on LT4 112 g daily. Does not miss doses. - in am - fasting - at least 30 min from b'fast - no Ca, Fe, MVI, PPIs - not on Biotin  Reviewed labs: Lab Results  Component Value Date   TSH 2.45 08/02/2016  09/29/2015: TSH 0.011 - on 150 mcg  Adrenal Insufficiency:  Reviewed hx: She has a h/o Prednisone tapers x 2 before back sx in 11/2015. No steroid inj, but has had steroid packs 2x a year.   Adrenal glands appeared normal on a CT abd. 02/2016.  02/08/2015 (at dx): cortisol (10:25 am): 1.8, ACTH 19.7 02/25/2015:  21 hydroxylase antibodies <1.0, anti-adrenal antibodies: negative, cortisol 20.6, ACTH 5.6, aldosterone <1.0, PRA <0.15, Sodium 141 (135-146), TSH 1.46  04/07/2015 (patient believes these labs were obtained on 5 mg of hydrocortisone daily only): ACTH 4.2, aldosterone <1.0, PRA 0.18  She was on HC 20 mg in am and 10 in pm >> previous endocrinologist tried to taper this down >> hospitalized when decreased to 10 mg daily.  She is now taking 12.5 mg in am and 7.5 mg in the pm. We had to increase his doses at last visit as she still had  generalized pain on 10 mg in a.m. and 5 mg in p.m. She feels good on this doses, and feels that the higher dose in p.m. was particularly helpful.  At last visits, she was telling me that she usually has a "spell" every 2 weeks >> flu-like illness, loss of appetite, generalized mm pain >> doubles the HC dose for 2-3 days >> resolves. She now seldom has these - had 2 recently b/c stress with moving to .  She has a dx of UC in 06/2015 >> postprandial diarrhea.  At last visit, we also diagnosed vitamin DM vitamin B12 deficiencies: Component     Latest Ref Rng & Units 08/02/2016  Vitamin B12     211 - 911 pg/mL 140 (L)  VITD     30.00 - 100.00 ng/mL 28.56 (L)    She was started on vitamin B12 injections every week for 4 weeks, and now continues them monthly. She was also started on vitamin D 2000 units daily.  ROS: Constitutional: no weight gain/no weight loss, + fatigue, no subjective hyperthermia, no subjective hypothermia Eyes: no blurry vision, no xerophthalmia ENT: no sore throat, no nodules palpated in throat, no dysphagia, no odynophagia, no hoarseness Cardiovascular: no CP/no SOB/no palpitations/no leg swelling Respiratory: no cough/no SOB/no wheezing Gastrointestinal: no N/no V/no D/no C/no acid reflux Musculoskeletal: + muscle aches/+ joint aches Skin: no rashes, no hair loss Neurological: no tremors/no numbness/no tingling/no dizziness  I reviewed pt's medications, allergies, PMH, social hx, family hx, and changes were documented in the history of present illness. Otherwise, unchanged from my initial visit note.   Past Medical History:  Diagnosis Date  . Addison disease ([place]) 2016  . Back pain   . Diabetes mellitus without complication ([place])   . Hyperlipidemia   . Hypertension   . Thyroid disease   . UC (ulcerative colitis) ([place]) 2017   Past Surgical History:  Procedure Laterality Date  . ABDOMINAL AORTOGRAM W/LOWER EXTREMITY N/A 09/10/2016   Procedure: Abdominal Aortogram w/Lower Extremity;  Surgeon: Courtney Mould, MD;  Location: [facility];  Service: Cardiovascular;  Laterality: N/A;  . BACK SURGERY  November 26 2014   Dr Courtney Bonilla  . CHOLECYSTECTOMY  03/2015  . HEMORROIDECTOMY  2015  . STENT PLACEMENT ILIAC ([place] [facility]X)  2014  . TOTAL ABDOMINAL HYSTERECTOMY  1981   Social History   Social History  . Marital status: Widowed    Spouse name: N/A  . Number of children: 1   Occupational History  . retired   Social History Main Topics  . Smoking status: Current Every Day Smoker, 1 pack a day   . Smokeless tobacco: Not on file  . Alcohol use No  . Drug use: No    Current Outpatient Prescriptions on  File Prior to Visit  Medication Sig Dispense Refill  . amLODipine (NORVASC) 10 MG tablet Take 1 tablet (10 mg total) by mouth daily. 90 tablet 3  . Cholecalciferol (VITAMIN D) 2000 units CAPS Take 2,000 Units by mouth daily.    . cilostazol (PLETAL) 50 MG tablet Take 1 tablet (50 mg total) by mouth 2 (two) times daily. 60 tablet 5  . glipiZIDE (GLUCOTROL XL) 10 MG 24 hr tablet TAKE ONE TABLET BY MOUTH ONCE DAILY WITH BREAKFAST 90 tablet 1  . glipiZIDE (GLUCOTROL) 5 MG tablet Take 1 tablet (5 mg total) by mouth daily before supper. 90 tablet 1  . hydrocortisone (CORTEF) 5 MG tablet Take 3 tablets in the morning &  1 tablet in the afternoon daily. (Patient taking differently: Take 7.5-12.5 mg by mouth See admin instructions. Take 2.5  tablets in the morning & 1.5 tablet in the afternoon daily.) 360 tablet 3  . levothyroxine (SYNTHROID, LEVOTHROID) 112 MCG tablet TAKE ONE TABLET BY MOUTH ONCE DAILY 60 tablet 1  . lisinopril-hydrochlorothiazide (ZESTORETIC) 20-12.5 MG tablet Take 1 tablet by mouth daily. 90 tablet 3  . saxagliptin HCl (ONGLYZA) 5 MG TABS tablet Take 1 tablet (5 mg total) by mouth daily. 90 tablet 0   No current facility-administered medications on file prior to visit.    Allergies  Allergen Reactions  . Other Other (See Comments)    Pt has Addison's Disease     Family history: See history of present illness + - Thyroid disease in mother and brother.  PE: BP 128/72 (BP Location: Left Arm, Patient Position: Sitting)   Pulse (!) 105   Ht 5' 4"  (1.626 m)   Wt 161 lb (73 kg)   LMP  (LMP Unknown)   SpO2 97%   BMI 27.64 kg/m  Wt Readings from Last 3 Encounters:  10/30/16 161 lb (73 kg)  09/10/16 165 lb (74.8 kg)  08/29/16 166 lb 1.6 oz (75.3 kg)   Constitutional: Slightly overweight, in NAD, extremely tanned, however, lighter complexion in areas covered by her bathing suite Eyes: PERRLA, EOMI, no exophthalmos ENT: moist mucous membranes, no thyromegaly, no cervical  lymphadenopathy Cardiovascular: RRR, No MRG Respiratory: CTA B Gastrointestinal: abdomen soft, NT, ND, BS+ Musculoskeletal: no deformities, strength intact in all 4 Skin: moist, warm, no rashes Neurological: no tremor with outstretched hands, DTR normal in all 4  ASSESSMENT: 1. DM2, non-insulin-dependent, uncontrolled, without long term complications, but with hyperglycemia - negative pancreatic antibodies  C-Peptide     0.80 - 3.85 ng/mL 11.87 (H)  Glucose, Fasting     65 - 99 mg/dL 173 (H)  She has good insulin production, no sign of DM1.  Component     Latest Ref Rng & Units 03/16/2016  Glutamic Acid Decarb Ab     <5 IU/mL <5  Pancreatic Islet Cell Antibody     <5 JDF Units <5   2. Hypothyroidism  3. Adrenal insufficiency  4. Fatigue  PLAN:  1. Patient with a history of relatively well controlled diabetes, on oral antidiabetic regimen, with much improved control Glipizide and Onglyza. Her sugars after dinner are still high, and we discussed about either reducing the size of the dinner, or trying to move Onglyza at lunchtime. - at last visits, we confirmed DM2, rather than DM1 - Continue checking sugars at different times of the day - check once a day, rotating checks - today, HbA1c is 6.7% (MUCH better!) - continue checking sugars at different times of the day - check 1x a day, rotating checks - advised for yearly eye exams >> she goes in 01/2017 - Will need to obtain her latest cholesterol level from PCP  - Return to clinic in 3 mo with sugar log   2. Hypothyroidism - latest thyroid labs reviewed with pt >> normal  - she continues on LT4 112 mcg daily - pt feels good on this dose. - we discussed about taking the thyroid hormone every day, with water, >30 minutes before breakfast, separated by >4 hours from acid reflux medications, calcium, iron, multivitamins. Pt. is taking it correctly - Will repeat TFTs at next visit  3. Adrenal insufficiency  - Unclear etiology,  but possibly 2/2 previous steroid use. 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 - She is now on HC 10 >> 12.5 mg in am and 5 >> 7.5 mg in pm - I will advise her to try to decrease the a.m. dose if possible - Reiterated sick days rules - she is aware of them. She did not have to apply them since last visit. - advised for Tiger bracelet mentioning "adrenal insufficiency". She ordered one.  4. Vitamin D deficiency - we started 2000 units vitamin D daily  - will check level at next visit  5. Vitamin B12 deficiency - we started B12 injections - initially weekly x 4 weeks, now monthly - will give one today  Patient Instructions   Please continue Hydrocortisone, but try to decrease the dose to: 10 mg in am 7.5 mg in pm   - You absolutely need to take this medication every day and not skip doses. - Please double the dose if you have a fever, for the duration of the fever. - If you cannot take anything by mouth (vomiting) or you have severe diarrhea so that you eliminate the hydrocortisone pills in your stool, please make sure that you get the hydrocortisone in the vein instead - go to the nearest emergency department/urgent care or you may go to your PCPs office  - Please try to get a MedAlert bracelet or pendant indicating: "Adrenal insufficiency".  Continue Levothyroxine 112 mcg daily.  Take the thyroid hormone every day, with water, at least 30 minutes before breakfast, separated by at least 4 hours from: - acid reflux medications - calcium - iron  Please continue: - Glipizide ER 10 mg in am - Glipizide 5 mg before dinner - Onglyza 5 mg but move before lunch   Before b'fast Before lunch Before dinner  Onglyza 5 mg  x   Glipizide ER/XL 10 mg x    Glipizide 5 mg   x   Please return in 3 months with your sugar log.  - time spent  with the patient: 40 minutes, of which >50% was spent in obtaining information about her symptoms, reviewing her previous labs, evaluations, and treatments, counseling her about her conditions (please see the discussed topics above), and developing a plan to further investigate and treat them.   Philemon Kingdom, MD PhD [facility]

## 2016-11-06 ENCOUNTER — Other Ambulatory Visit: Payer: Self-pay | Admitting: Internal Medicine

## 2016-11-27 ENCOUNTER — Other Ambulatory Visit: Payer: Self-pay | Admitting: Internal Medicine

## 2016-11-27 ENCOUNTER — Other Ambulatory Visit: Payer: Self-pay | Admitting: Medical

## 2016-11-30 ENCOUNTER — Encounter: Payer: Self-pay | Admitting: Vascular Surgery

## 2016-12-04 ENCOUNTER — Ambulatory Visit (INDEPENDENT_AMBULATORY_CARE_PROVIDER_SITE_OTHER): Payer: BLUE CROSS/BLUE SHIELD

## 2016-12-04 DIAGNOSIS — E538 Deficiency of other specified B group vitamins: Secondary | ICD-10-CM

## 2016-12-04 MED ORDER — CYANOCOBALAMIN 1000 MCG/ML IJ SOLN
1000.0000 ug | Freq: Once | INTRAMUSCULAR | Status: AC
  Administered 2016-12-04: 1000 ug via INTRAMUSCULAR

## 2016-12-06 NOTE — Addendum Note (Signed)
Addended by: Burton ApleyPETTY, Alexiss Iturralde A on: 12/06/2016 03:55 PM   Modules accepted: Orders

## 2016-12-12 ENCOUNTER — Ambulatory Visit (HOSPITAL_COMMUNITY)
Admission: RE | Admit: 2016-12-12 | Discharge: 2016-12-12 | Disposition: A | Discharge status: Home / Self Care | Payer: BLUE CROSS/BLUE SHIELD | Source: Ambulatory Visit | Attending: Vascular Surgery | Admitting: Vascular Surgery

## 2016-12-12 ENCOUNTER — Ambulatory Visit (INDEPENDENT_AMBULATORY_CARE_PROVIDER_SITE_OTHER): Payer: BLUE CROSS/BLUE SHIELD | Admitting: Vascular Surgery

## 2016-12-12 ENCOUNTER — Encounter: Payer: Self-pay | Admitting: Vascular Surgery

## 2016-12-12 VITALS — BP 122/67 | HR 83 | Temp 98.2°F | Resp 16 | Ht 64.0 in | Wt 154.0 lb

## 2016-12-12 DIAGNOSIS — I739 Peripheral vascular disease, unspecified: Secondary | ICD-10-CM | POA: Diagnosis not present

## 2016-12-12 NOTE — Progress Notes (Signed)
Patient name: Courtney Bonilla Olivares MRN: [id number] DOB: 01/28/1956 Sex: female  REASON FOR VISIT:    Follow up of peripheral vascular disease  HPI:   Courtney Bonilla Bogusz is a pleasant 61 y.o. female who underwent a CO2 aortogram on 09/10/2016. She had presented with left lower extremity claudication. She had a previous left common femoral artery stent in for January. Her symptoms were significantly disabling and she wished to pursue arteriography. Her creatinine was 2.7 and therefore the study was done with CO2. The patient had an occluded external iliac artery on the left. I did not think the patient was an ideal candidate for a right to left femorofemoral bypass graft given that there is a stent in the left common femoral artery and the patient also had superficial femoral artery disease on the right which would put him at risk for progressive right lower extremity symptoms. Also given that the patient was just having claudication I would not recommend aortofemoral bypass grafting. The patient was young to be considered for an axillobifemoral bypass graft. For all these reasons I recommended an aggressive conservative approach and set her up for a 3 month follow up visit.  She continues to have I lateral lower extremity claudication. Her symptoms are significantly worse on the left side. She can walk to her car in the parking lot before experiencing symptoms. Her symptoms are mostly in the calf and also involve her thigh especially on the left. Her symptoms have been gradually progressive. She denies any history of rest pain or nonhealing ulcers.  She is currently not on aspirin and is not on a statin.  Past Medical History:  Diagnosis Date  . Addison disease (HCC) 2016  . Back pain   . Diabetes mellitus without complication (HCC)   . Hyperlipidemia   . Hypertension   . Thyroid disease   . UC (ulcerative colitis) (HCC) 2017    Family History  Problem Relation Age of Onset  . Diabetes Mother   .  Hypertension Mother   . Thyroid disease Mother   . Hypertension Father   . Diabetes Sister   . Hypertension Sister   . Thyroid disease Sister   . Diabetes Brother   . Hypertension Brother   . Thyroid disease Brother     SOCIAL HISTORY: Social History  Substance Use Topics  . Smoking status: Current Every Day Smoker    Packs/day: 1.00    Years: 40.00    Types: Cigarettes  . Smokeless tobacco: Never Used  . Alcohol use No    Allergies  Allergen Reactions  . Other Other (See Comments)    Pt has Addison's Disease     Current Outpatient Prescriptions  Medication Sig Dispense Refill  . amLODipine (NORVASC) 10 MG tablet Take 1 tablet (10 mg total) by mouth daily. 90 tablet 3  . Cholecalciferol (VITAMIN D) 2000 units CAPS Take 2,000 Units by mouth daily.    . cilostazol (PLETAL) 50 MG tablet TAKE ONE TABLET BY MOUTH TWICE DAILY 60 tablet 5  . glipiZIDE (GLUCOTROL XL) 10 MG 24 hr tablet TAKE ONE TABLET BY MOUTH ONCE DAILY WITH BREAKFAST 90 tablet 1  . glipiZIDE (GLUCOTROL) 5 MG tablet TAKE ONE TABLET BY MOUTH DAILY BEFORE  SUPPER. 90 tablet 1  . hydrocortisone (CORTEF) 5 MG tablet Take 3 tablets in the morning & 1 tablet in the afternoon daily. (Patient taking differently: Take 2 tablets in the morning & 1 tablet in the afternoon daily.) 360 tablet 3  . levothyroxine (  SYNTHROID, LEVOTHROID) 112 MCG tablet TAKE ONE TABLET BY MOUTH ONCE DAILY 60 tablet 1  . lisinopril-hydrochlorothiazide (ZESTORETIC) 20-12.5 MG tablet Take 1 tablet by mouth daily. 90 tablet 3  . ONGLYZA 5 MG TABS tablet TAKE ONE TABLET BY MOUTH DAILY. 90 tablet 0  . saxagliptin HCl (ONGLYZA) 5 MG TABS tablet Take 1 tablet (5 mg total) by mouth daily. 90 tablet 0   No current facility-administered medications for this visit.     REVIEW OF SYSTEMS:  [X]  denotes positive finding, [ ]  denotes negative finding Cardiac  Comments:  Chest pain or chest pressure:    Shortness of breath upon exertion:    Short of breath  when lying flat:    Irregular heart rhythm:        Vascular    Pain in calf, thigh, or hip brought on by ambulation: X   Pain in feet at night that wakes you up from your sleep:  X   Blood clot in your veins:    Leg swelling:         Pulmonary    Oxygen at home:    Productive cough:     Wheezing:         Neurologic    Sudden weakness in arms or legs:     Sudden numbness in arms or legs:     Sudden onset of difficulty speaking or slurred speech:    Temporary loss of vision in one eye:     Problems with dizziness:         Gastrointestinal    Blood in stool:     Vomited blood:         Genitourinary    Burning when urinating:     Blood in urine:        Psychiatric    Major depression:         Hematologic    Bleeding problems:    Problems with blood clotting too easily:        Skin    Rashes or ulcers:        Constitutional    Fever or chills:     PHYSICAL EXAM:   Vitals:   12/12/16 0949  BP: 122/67  Pulse: 83  Resp: 16  Temp: 98.2 F (36.8 C)  TempSrc: Oral  SpO2: 99%  Weight: 154 lb (69.9 kg)  Height: 5\' 4"  (1.626 m)    GENERAL: The patient is a well-nourished female, in no acute distress. The vital signs are documented above. CARDIAC: There is a regular rate and rhythm.  VASCULAR: I do not detect carotid bruits. On the left side, which is the more symptomatic side, I cannot palpate a femoral pulse, popliteal pulse or pedal pulses. On the right side, she has a palpable femoral pulse and a palpable posterior tibial pulse. She has no significant lower extremity swelling. PULMONARY: There is good air exchange bilaterally without wheezing or rales. ABDOMEN: Soft and non-tender with normal pitched bowel sounds.  MUSCULOSKELETAL: There are no major deformities or cyanosis. NEUROLOGIC: No focal weakness or paresthesias are detected. SKIN: There are no ulcers or rashes noted. PSYCHIATRIC: The patient has a normal affect.  DATA:    ARTERIOGRAM: I have  reviewed her arteriogram which was done on 09/10/2016.  On the left side, which is the more symptomatic side, the common iliac artery was patent. The left external iliac artery was occluded. The common femoral artery where she had the previous stent was occluded. There was reconstitution  of the proximal superficial femoral artery and deep femoral artery.  On the right side the common iliac and external iliac arteries have mild diffuse disease. The common femoral, superficial femoral, deep femoral arteries were patent. There was a moderate stenosis at the adductor canal.  ARTERIAL DOPPLER STUDY: I have independently interpreted her arterial Doppler study today.  On the left side, which is the symptomatic side, she has a monophasic dorsalis pedis and posterior tibial signal. ABI on the left is 64% with a toe pressure of 50 mmHg.  On the right side, she has a biphasic dorsalis pedis and posterior tibial signal. ABI is 100% on the right. Toe pressure on the right is 64 mmHg.  LABS: Her creatinine on 09/10/2016 was 2.7.  MEDICAL ISSUES:   AORTOILIAC OCCLUSIVE DISEASE:  I continue to favor a conservative approach and we have discussed the importance of tobacco cessation and a structured walking program. In addition I have asked her to begin taking 81 mg of aspirin a day. I have explained that in our literature there is evidence that patients with peripheral vascular disease benefit from being both on aspirin and a low-dose statin. However, I would prefer to have her discuss starting a statin with her primary care physician. We've also discussed the importance of nutrition.  Certainly if her symptoms progress she could be considered for revascularization. I have carefully reviewed her arteriogram and she could potentially be a candidate for a right to left femorofemoral bypass graft. She does have some mild diffuse inflow disease on the right and also disease in the right superficial femoral artery. This  would put her at risk for developing worsening symptoms on the right. However, she has a reasonable right femoral pulse I do not think this would be unreasonable.  She is fairly young to be considered for an axillobifemoral bypass. Alternatively, we could consider an aortobifemoral bypass graft after preoperative cardiac evaluation. She does have multiple medical comorbidities including diabetes and hypertension. She also has Addison's disease.   I'll plan on seeing her back in 6 months with follow up ABIs at that time. She is to call sooner if she has problems.  Waverly Ferrari [facility] [phone]

## 2016-12-26 NOTE — Addendum Note (Signed)
Addended by: Burton ApleyPETTY, Sontee Desena A on: 12/26/2016 10:20 AM   Modules accepted: Orders

## 2017-01-03 ENCOUNTER — Ambulatory Visit (INDEPENDENT_AMBULATORY_CARE_PROVIDER_SITE_OTHER): Payer: BLUE CROSS/BLUE SHIELD

## 2017-01-03 DIAGNOSIS — E538 Deficiency of other specified B group vitamins: Secondary | ICD-10-CM

## 2017-01-03 MED ORDER — CYANOCOBALAMIN 1000 MCG/ML IJ SOLN
1000.0000 ug | Freq: Once | INTRAMUSCULAR | Status: AC
  Administered 2017-01-03: 1000 ug via INTRAMUSCULAR

## 2017-01-03 MED ORDER — CYANOCOBALAMIN 1000 MCG/ML IJ SOLN: 1000.0000 ug | Freq: Once | INTRAMUSCULAR | 0 refills | Status: DC

## 2017-01-09 NOTE — Progress Notes (Signed)
  Reviewed. Kiaan Overholser, MD PhD [facility]  

## 2017-01-22 ENCOUNTER — Telehealth: Payer: Self-pay | Admitting: Family Medicine

## 2017-01-22 NOTE — Telephone Encounter (Signed)
ok 

## 2017-01-22 NOTE — Telephone Encounter (Signed)
That is fine 

## 2017-01-22 NOTE — Telephone Encounter (Signed)
PT need a Med Ck appt either this week or next week in order to get refills on meds. She will be leaving to go to [place] next week and will not return until March. Dr Susann GivensLalonde did not have openings for Med Ck's this week and will on vacation next week so scheduled pt with Vincenza HewsShane next week. Is this ok?

## 2017-01-24 ENCOUNTER — Ambulatory Visit (INDEPENDENT_AMBULATORY_CARE_PROVIDER_SITE_OTHER): Payer: BLUE CROSS/BLUE SHIELD | Admitting: Internal Medicine

## 2017-01-24 ENCOUNTER — Ambulatory Visit: Payer: BLUE CROSS/BLUE SHIELD

## 2017-01-24 ENCOUNTER — Encounter: Payer: Self-pay | Admitting: Internal Medicine

## 2017-01-24 VITALS — BP 120/60 | HR 79 | Ht 64.0 in | Wt 160.0 lb

## 2017-01-24 DIAGNOSIS — E1165 Type 2 diabetes mellitus with hyperglycemia: Secondary | ICD-10-CM | POA: Diagnosis not present

## 2017-01-24 DIAGNOSIS — E039 Hypothyroidism, unspecified: Secondary | ICD-10-CM

## 2017-01-24 DIAGNOSIS — E538 Deficiency of other specified B group vitamins: Secondary | ICD-10-CM

## 2017-01-24 DIAGNOSIS — E2740 Unspecified adrenocortical insufficiency: Secondary | ICD-10-CM

## 2017-01-24 DIAGNOSIS — E559 Vitamin D deficiency, unspecified: Secondary | ICD-10-CM | POA: Diagnosis not present

## 2017-01-24 LAB — VITAMIN D 25 HYDROXY (VIT D DEFICIENCY, FRACTURES): VITD: 50.53 ng/mL (ref 30.00–100.00)

## 2017-01-24 LAB — VITAMIN B12: VITAMIN B 12: 709 pg/mL (ref 211–911)

## 2017-01-24 LAB — TSH: TSH: 0.04 u[IU]/mL — ABNORMAL LOW (ref 0.35–4.50)

## 2017-01-24 LAB — T4, FREE: FREE T4: 1.24 ng/dL (ref 0.60–1.60)

## 2017-01-24 LAB — POCT GLYCOSYLATED HEMOGLOBIN (HGB A1C): Hemoglobin A1C: 6

## 2017-01-24 MED ORDER — SAXAGLIPTIN HCL 5 MG PO TABS: 5.0000 mg | ORAL_TABLET | Freq: Every day | ORAL | 3 refills | Status: AC

## 2017-01-24 MED ORDER — CYANOCOBALAMIN 1000 MCG/ML IJ SOLN
1000.0000 ug | Freq: Once | INTRAMUSCULAR | Status: AC
  Administered 2017-01-24: 1000 ug via INTRAMUSCULAR

## 2017-01-24 MED ORDER — HYDROCORTISONE 5 MG PO TABS: ORAL_TABLET | ORAL | 3 refills | Status: AC

## 2017-01-24 MED ORDER — GLIPIZIDE ER 5 MG PO TB24: 5.0000 mg | ORAL_TABLET | Freq: Every day | ORAL | 3 refills | Status: AC

## 2017-01-24 MED ORDER — GLIPIZIDE 5 MG PO TABS: ORAL_TABLET | ORAL | 3 refills | Status: AC

## 2017-01-24 MED ORDER — LEVOTHYROXINE SODIUM 100 MCG PO TABS: 112.0000 ug | ORAL_TABLET | Freq: Every day | ORAL | 1 refills | Status: DC

## 2017-01-24 NOTE — Progress Notes (Signed)
Patient ID: Courtney Bonilla, female   DOB: [birth year], 61 y.o.   MRN: [id number]   HPI: Courtney Bonilla is a 61 y.o.-year-old female, returning for f/u for DM2, dx in 2014, non-insulin-dependent, uncontrolled, without complications, also hypothyroidism, Adrenal insufficiency, dx 01/2015. Last OV 3 mo ago. She saw endocrinologist in [place]: Dr. Joan Mayans  She will go to [place] for Fall and Winter >> returns in March 2019.  Reviewed Hemoglobin A1c levels Lab Results  Component Value Date   HGBA1C 6.7 10/30/2016   HGBA1C 8.3 05/21/2016   HGBA1C 9.2 03/16/2016   09/29/2015: HbA1c 6.9%  Pt is on a regimen of: - Glipizide ER 10 mg in am - Glipizide 5 mg before dinner - Onglyza 5 mg before lunch   Before b'fast Before lunch Before dinner  Onglyza 5 mg  x   Glipizide ER/XL 10 mg x    Glipizide 5 mg   x   Insurance did not approve Januvia. Metformin caused nausea. She also has CKD.  Pt checks her sugars 1-2x a day: - am: 82, 111-152 >> 150-155 >> 89-90s >> 80s-90s >> 70s-80s - 2h after b'fast: n/c - before lunch: n/c >> 202 >> 130s >> 120s >> n/c - 2h after lunch: n/c >> 250 >> n/c >> 150s >> 120-130s - before dinner: n/c - 2h after dinner:  170-192 >> 170-217, but not checking regularly >> n/c - bedtime: n/c - nighttime: n/c No lows. Lowest sugar was 80s >> 77; ? hypoglycemia awareness Highest sugar was 217 >> 139.  Glucometer: ReliOn  - She has CKD, last BUN/creatinine higher (was taking a lot of Ibuprofen):  Lab Results  Component Value Date   BUN 36 (H) 09/10/2016   BUN 20 07/16/2016   CREATININE 2.70 (H) 09/10/2016   CREATININE 1.78 (H) 07/16/2016  09/29/2015: 15/1.77, EGFR 51, ACR 84.6 She is on lisinopril 20 mg daily. - last set of lipids: 09/29/2015: 274/534/32/134 No results found for: CHOL, HDL, LDLCALC, LDLDIRECT, TRIG, CHOLHDL  She is not on a statin. - last eye exam was in 01/2016 >> No DR.  - no numbness and tingling in her feet. She has PVD >> has mm  pain (claudication) in L leg - has a h/o stent.  She saw vascular surgeon >> has claudication >> vascular blockages, unstentable. Was on Pletal >> now off.  Hypothyroidism:  Pt is on levothyroxine 112 mcg daily, taken: - in am - fasting - at least 30 min from b'fast - no Ca, Fe, MVI, PPIs - not on Biotin  Reviewed labs: Lab Results  Component Value Date   TSH 2.45 08/02/2016  09/29/2015: TSH 0.011 - on 150 mcg  Adrenal Insufficiency:  Reviewed hx: She has a h/o Prednisone tapers x 2 before back sx in 11/2015. No steroid inj, but has had steroid packs 2x a year.   Adrenal glands appeared normal on a CT abd. 02/2016.  02/08/2015 (at dx): cortisol (10:25 am): 1.8, ACTH 19.7 02/25/2015:  21 hydroxylase antibodies <1.0, anti-adrenal antibodies: negative, cortisol 20.6, ACTH 5.6, aldosterone <1.0, PRA <0.15, Sodium 141 (135-146), TSH 1.46  04/07/2015 (patient believes these labs were obtained on 5 mg of hydrocortisone daily only): ACTH 4.2, aldosterone <1.0, PRA 0.18  She was on HC 20 mg in am and 10 in pm >> previous endocrinologist tried to taper this down >> hospitalized when decreased to 10 mg daily.  She is now taking 10 mg in am and 5 mg in the pm. She feels good on this  dose.  She has a dx of UC in 06/2015 >> Postprandial diarrhea  She also has vitamin D and B12 def: Component     Latest Ref Rng & Units 08/02/2016  Vitamin B12     211 - 911 pg/mL 140 (L)  VITD     30.00 - 100.00 ng/mL 28.56 (L)   She was started on vitamin B12 injections every week for 4 weeks, and now continues them monthly She was also started on vitamin D 2000 units daily.  ROS: Constitutional: no weight gain/no weight loss, + fatigue, no subjective hyperthermia, no subjective hypothermia Eyes: no blurry vision, no xerophthalmia ENT: no sore throat, no nodules palpated in throat, no dysphagia, no odynophagia, no hoarseness Cardiovascular: no CP/no SOB/no palpitations/no leg swelling Respiratory:  no cough/no SOB/no wheezing Gastrointestinal: no N/no V/no D/no C/no acid reflux Musculoskeletal: + muscle aches/no joint aches Skin: no rashes, no hair loss Neurological: no tremors/no numbness/no tingling/no dizziness  I reviewed pt's medications, allergies, PMH, social hx, family hx, and changes were documented in the history of present illness. Otherwise, unchanged from my initial visit note.   Past Medical History:  Diagnosis Date  . Addison disease ([place] [facility]) 2016  . Back pain   . Diabetes mellitus without complication ([place])   . Hyperlipidemia   . Hypertension   . Thyroid disease   . UC (ulcerative colitis) ([place]) 2017   Past Surgical History:  Procedure Laterality Date  . ABDOMINAL AORTOGRAM W/LOWER EXTREMITY N/A 09/10/2016   Procedure: Abdominal Aortogram w/Lower Extremity;  Surgeon: Angelia Mould, MD;  Location: [facility];  Service: Cardiovascular;  Laterality: N/A;  . BACK SURGERY  November 26 2014   Dr Sherley Bounds  . CHOLECYSTECTOMY  03/2015  . HEMORROIDECTOMY  2015  . STENT PLACEMENT ILIAC ([place] [facility]X)  2014  . TOTAL ABDOMINAL HYSTERECTOMY  1981   Social History   Social History  . Marital status: Widowed    Spouse name: N/A  . Number of children: 1   Occupational History  . retired   Social History Main Topics  . Smoking status: Current Every Day Smoker, 1 pack a day   . Smokeless tobacco: Not on file  . Alcohol use No  . Drug use: No    Current Outpatient Prescriptions on File Prior to Visit  Medication Sig Dispense Refill  . amLODipine (NORVASC) 10 MG tablet Take 1 tablet (10 mg total) by mouth daily. 90 tablet 3  . Cholecalciferol (VITAMIN D) 2000 units CAPS Take 2,000 Units by mouth daily.    Marland Kitchen glipiZIDE (GLUCOTROL XL) 10 MG 24 hr tablet TAKE ONE TABLET BY MOUTH ONCE DAILY WITH BREAKFAST 90 tablet 1  . glipiZIDE (GLUCOTROL) 5 MG tablet TAKE ONE TABLET BY MOUTH DAILY BEFORE  SUPPER. 90 tablet 1  . hydrocortisone (CORTEF) 5 MG tablet Take 3  tablets in the morning & 1 tablet in the afternoon daily. (Patient taking differently: Take 2 tablets in the morning & 1 tablet in the afternoon daily.) 360 tablet 3  . levothyroxine (SYNTHROID, LEVOTHROID) 112 MCG tablet TAKE ONE TABLET BY MOUTH ONCE DAILY 60 tablet 1  . lisinopril-hydrochlorothiazide (ZESTORETIC) 20-12.5 MG tablet Take 1 tablet by mouth daily. 90 tablet 3  . ONGLYZA 5 MG TABS tablet TAKE ONE TABLET BY MOUTH DAILY. 90 tablet 0  . saxagliptin HCl (ONGLYZA) 5 MG TABS tablet Take 1 tablet (5 mg total) by mouth daily. 90 tablet 0  . cilostazol (PLETAL) 50 MG tablet TAKE ONE TABLET  BY MOUTH TWICE DAILY (Patient not taking: Reported on 01/24/2017) 60 tablet 5   No current facility-administered medications on file prior to visit.    Allergies  Allergen Reactions  . Other Other (See Comments)    Pt has Addison's Disease     Family history: See history of present illness + - Thyroid disease in mother and brother.  PE: BP 120/60 (BP Location: Left Arm, Patient Position: Sitting)   Pulse 79   Ht 5' 4"  (1.626 m)   Wt 160 lb (72.6 kg)   LMP  (LMP Unknown)   SpO2 96%   BMI 27.46 kg/m  Wt Readings from Last 3 Encounters:  01/24/17 160 lb (72.6 kg)  12/12/16 154 lb (69.9 kg)  10/30/16 161 lb (73 kg)   Constitutional: slightly overweight, in NAD Eyes: PERRLA, EOMI, no exophthalmos ENT: moist mucous membranes, no thyromegaly, no cervical lymphadenopathy Cardiovascular: RRR, No MRG Respiratory: CTA B Gastrointestinal: abdomen soft, NT, ND, BS+ Musculoskeletal: no deformities, strength intact in all 4 Skin: moist, warm, no rashes Neurological: no tremor with outstretched hands, DTR normal in all 4  ASSESSMENT: 1. DM2, non-insulin-dependent, uncontrolled, without long term complications, but with hyperglycemia - negative pancreatic antibodies  C-Peptide     0.80 - 3.85 ng/mL 11.87 (H)  Glucose, Fasting     65 - 99 mg/dL 173 (H)  She has good insulin production, no sign of  DM1.  Component     Latest Ref Rng & Units 03/16/2016  Glutamic Acid Decarb Ab     <5 IU/mL <5  Pancreatic Islet Cell Antibody     <5 JDF Units <5   2. Hypothyroidism  3. Adrenal insufficiency  4. Fatigue  PLAN:  1. Patient with a history of relatively well controlled diabetes, on oral medication only, with improved control since last visit >> will decrease Glipizide. - today, HbA1c is 6% ([facility]) - continue checking sugars at different times of the day - check 1x a day, rotating checks - advised for yearly eye exams >> she needs one soon - Return to clinic in 7 mo with sugar log   2. Hypothyroidism - latest thyroid labs reviewed with pt >> normal (07/2016) - she continues on LT4 112 mcg daily - pt feels good on this dose. - we discussed about taking the thyroid hormone every day, with water, >30 minutes before breakfast, separated by >4 hours from acid reflux medications, calcium, iron, multivitamins. Pt. is taking it correctly - will check thyroid tests now: TSH and fT4  3. Adrenal insufficiency  - unclear etiology, but possibly 2/2 prev. Steroid use.  - now doing well on 10 mg in am and 5 mg in pm HC - did not have to double the dose since last visit - Reiterated sick days rules - she is aware of them. She did not have to apply them since last visit.  4. Vitamin D deficiency - on 2000 units vitamin D daily  - will check level today  5. Vitamin B12 deficiency - on B12 injections monthly - will check level today  Patient Instructions   Please continue Hydrocortisone: 10 mg in am 5 mg in pm   - You absolutely need to take this medication every day and not skip doses. - Please double the dose if you have a fever, for the duration of the fever. - If you cannot take anything by mouth (vomiting) or you have severe diarrhea so that you eliminate the hydrocortisone pills in your stool, please  make sure that you get the hydrocortisone in the vein instead - go to the nearest  emergency department/urgent care or you may go to your PCPs office  - Please try to get a MedAlert bracelet or pendant indicating: "Adrenal insufficiency".  Continue Levothyroxine 112 mcg daily.  Take the thyroid hormone every day, with water, at least 30 minutes before breakfast, separated by at least 4 hours from: - acid reflux medications - calcium - iron  Please continue: - Glipizide ER 10 mg in am - Glipizide 5 mg before dinner - Onglyza 5 mg before lunch   Before b'fast Before lunch Before dinner  Onglyza 5 mg  x   Glipizide ER/XL 10 mg x    Glipizide 5 mg   x   Please return in 7 months with your sugar log.  Needs refills 90 days LT4.  Component     Latest Ref Rng & Units 01/24/2017  TSH     0.35 - 4.50 uIU/mL 0.04 (L)  Hemoglobin A1C      6.0  T4,Free(Direct)     0.60 - 1.60 ng/dL 1.24  Vitamin B12     211 - 911 pg/mL 709  VITD     30.00 - 100.00 ng/mL 50.53    Normal vitamin B12 and vitamin D levels. We'll  Continue current supplements.  however, TSH is low, so we'll need to decrease her levothyroxine dose to 100 mcg daily and check labs again in 1.5 mo.  Philemon Kingdom, MD PhD [facility]

## 2017-01-24 NOTE — Patient Instructions (Addendum)
Please continue Hydrocortisone: 10 mg in am 5 mg in pm   - You absolutely need to take this medication every day and not skip doses. - Please double the dose if you have a fever, for the duration of the fever. - If you cannot take anything by mouth (vomiting) or you have severe diarrhea so that you eliminate the hydrocortisone pills in your stool, please make sure that you get the hydrocortisone in the vein instead - go to the nearest emergency department/urgent care or you may go to your PCPs office  - Please try to get a MedAlert bracelet or pendant indicating: "Adrenal insufficiency".  Continue Levothyroxine 112 mcg daily.  Take the thyroid hormone every day, with water, at least 30 minutes before breakfast, separated by at least 4 hours from: - acid reflux medications - calcium - iron  Please decrease: - Glipizide ER to 5 mg in am - Glipizide to 2.5 mg before dinner  Continue: - Onglyza 5 mg before lunch   Before b'fast Before lunch Before dinner  Onglyza 5 mg  x   Glipizide ER/XL 5 mg x    Glipizide 2.5 mg   x   Please return in 6 months with your sugar log.

## 2017-01-28 ENCOUNTER — Ambulatory Visit (INDEPENDENT_AMBULATORY_CARE_PROVIDER_SITE_OTHER): Payer: BLUE CROSS/BLUE SHIELD | Admitting: Medical

## 2017-01-28 ENCOUNTER — Encounter: Payer: Self-pay | Admitting: Medical

## 2017-01-28 VITALS — BP 110/60 | HR 100 | Resp 11 | Ht 64.0 in | Wt 160.0 lb

## 2017-01-28 DIAGNOSIS — N289 Disorder of kidney and ureter, unspecified: Secondary | ICD-10-CM | POA: Diagnosis not present

## 2017-01-28 DIAGNOSIS — I739 Peripheral vascular disease, unspecified: Secondary | ICD-10-CM

## 2017-01-28 DIAGNOSIS — E039 Hypothyroidism, unspecified: Secondary | ICD-10-CM

## 2017-01-28 DIAGNOSIS — F17200 Nicotine dependence, unspecified, uncomplicated: Secondary | ICD-10-CM

## 2017-01-28 DIAGNOSIS — E1159 Type 2 diabetes mellitus with other circulatory complications: Secondary | ICD-10-CM

## 2017-01-28 DIAGNOSIS — I10 Essential (primary) hypertension: Secondary | ICD-10-CM

## 2017-01-28 DIAGNOSIS — Z79899 Other long term (current) drug therapy: Secondary | ICD-10-CM | POA: Diagnosis not present

## 2017-01-28 DIAGNOSIS — E1165 Type 2 diabetes mellitus with hyperglycemia: Secondary | ICD-10-CM | POA: Diagnosis not present

## 2017-01-28 DIAGNOSIS — E559 Vitamin D deficiency, unspecified: Secondary | ICD-10-CM | POA: Diagnosis not present

## 2017-01-28 LAB — RENAL FUNCTION PANEL
Albumin: 4.3 g/dL (ref 3.6–5.1)
BUN: 25 mg/dL (ref 7–25)
CHLORIDE: 106 mmol/L (ref 98–110)
CO2: 19 mmol/L — AB (ref 20–32)
CREATININE: 2.34 mg/dL — AB (ref 0.50–0.99)
Calcium: 9.3 mg/dL (ref 8.6–10.4)
Glucose, Bld: 112 mg/dL — ABNORMAL HIGH (ref 65–99)
Phosphorus: 3.8 mg/dL (ref 2.5–4.5)
Potassium: 4.2 mmol/L (ref 3.5–5.3)
SODIUM: 136 mmol/L (ref 135–146)

## 2017-01-28 LAB — CBC WITH DIFFERENTIAL/PLATELET
BASOS PCT: 0 %
Basophils Absolute: 0 cells/uL (ref 0–200)
Eosinophils Absolute: 83 cells/uL (ref 15–500)
Eosinophils Relative: 1 %
HEMATOCRIT: 34.8 % — AB (ref 35.0–45.0)
HEMOGLOBIN: 12.1 g/dL (ref 11.7–15.5)
LYMPHS ABS: 1743 {cells}/uL (ref 850–3900)
Lymphocytes Relative: 21 %
MCH: 33.2 pg — AB (ref 27.0–33.0)
MCHC: 34.8 g/dL (ref 32.0–36.0)
MCV: 95.6 fL (ref 80.0–100.0)
MONO ABS: 415 {cells}/uL (ref 200–950)
MPV: 10 fL (ref 7.5–12.5)
Monocytes Relative: 5 %
NEUTROS PCT: 73 %
Neutro Abs: 6059 cells/uL (ref 1500–7800)
Platelets: 218 10*3/uL (ref 140–400)
RBC: 3.64 MIL/uL — AB (ref 3.80–5.10)
RDW: 13.8 % (ref 11.0–15.0)
WBC: 8.3 10*3/uL (ref 4.0–10.5)

## 2017-01-28 LAB — PHOSPHORUS: Phosphorus: 3.9 mg/dL (ref 2.5–4.5)

## 2017-01-28 MED ORDER — PRAVASTATIN SODIUM 20 MG PO TABS: 20.0000 mg | ORAL_TABLET | Freq: Every day | ORAL | 1 refills | Status: AC

## 2017-01-28 MED ORDER — ASPIRIN EC 81 MG PO TBEC: 81.0000 mg | DELAYED_RELEASE_TABLET | Freq: Every day | ORAL | 3 refills | Status: AC

## 2017-01-28 MED ORDER — AMLODIPINE BESYLATE 10 MG PO TABS: 10.0000 mg | ORAL_TABLET | Freq: Every day | ORAL | 1 refills | Status: DC

## 2017-01-28 NOTE — Progress Notes (Signed)
Subjective: Chief Complaint  Patient presents with  . Medication Management   Here for refills on BP medication.  He[place] to [place] in [place] months.  Needs to make sure she doesn't run out of the blood pressure medications.  She saw endocrinology recently for f/u.  She notes back in March and in June saw Vein and Vascular surgery about peripheral vascular disease.  Was advised exercise, smoking cessation, aspirin and statin.   She notes she is only able to do limited exercise given the leg pains.  Uses tylenol for pain. But not taking aspirin or statin.  Still smokes  She doesn't like cold weather, so plans on hanging out with brother until spring  On steroid daily given adrenal insufficiency/Addison's disease.   No prior bone density scan  Hx/o abnormal creatine.  She denise prior nephrology consult.  Past Medical History:  Diagnosis Date  . Addison disease (HCC) 2016  . Back pain   . Diabetes mellitus without complication (HCC)   . Hyperlipidemia   . Hypertension   . Thyroid disease   . UC (ulcerative colitis) (HCC) 2017   Current Outpatient Prescriptions on File Prior to Visit  Medication Sig Dispense Refill  . Cholecalciferol (VITAMIN D) 2000 units CAPS Take 2,000 Units by mouth daily.    Marland Kitchen. glipiZIDE (GLUCOTROL XL) 5 MG 24 hr tablet Take 1 tablet (5 mg total) by mouth daily with breakfast. 90 tablet 3  . glipiZIDE (GLUCOTROL) 5 MG tablet TAKE ONE TABLET BY MOUTH DAILY BEFORE  SUPPER. 90 tablet 3  . hydrocortisone (CORTEF) 5 MG tablet Take 2 tablets in the morning & 1 tablet in the afternoon daily. 300 tablet 3  . levothyroxine (SYNTHROID, LEVOTHROID) 100 MCG tablet Take 1 tablet (100 mcg total) by mouth daily. 90 tablet 1  . lisinopril-hydrochlorothiazide (ZESTORETIC) 20-12.5 MG tablet Take 1 tablet by mouth daily. 90 tablet 3  . saxagliptin HCl (ONGLYZA) 5 MG TABS tablet Take 1 tablet (5 mg total) by mouth daily. 90 tablet 3   No current  facility-administered medications on file prior to visit.    ROS as in subjective   Objective: BP 110/60   Pulse 100   Resp 11   Ht 5\' 4"  (1.626 m)   Wt 160 lb (72.6 kg)   LMP  (LMP Unknown)   SpO2 97%   BMI 27.46 kg/m   General appearance: alert, no distress, WD/WN Skin: dark tanned appearing skin throughout Neck: supple, no lymphadenopathy, no thyromegaly, no masses Heart: RRR, normal S1, S2, no murmurs Lungs: CTA bilaterally, no wheezes, rhonchi, or rales Abdomen: +bs, soft, non tender, non distended, no masses, no hepatomegaly, no splenomegaly Pulses: 2+ symmetric, upper and lower extremities, normal cap refill Ext: no edema    Assessment: Encounter Diagnoses  Name Primary?  . Renal insufficiency Yes  . Hypertension associated with diabetes (HCC)   . Hypothyroidism, unspecified type   . Type 2 diabetes mellitus with hyperglycemia, without long-term current use of insulin (HCC)   . Vitamin D deficiency   . High risk medication use   . PVD (peripheral vascular disease) (HCC)   . Smoker     Plan: Additional labs today given abnormal creatinine labs in past year.   Consider nephrology consult.    advised bone density scan given long term steroid use, but she declines for now as she will be out of state for months.  advised smoking cessation  She is taking 2000 IU Vit D regularly  reviewed recent endocrinology notes and labs.  Gave script to have lipid lab done in [place] in 3 months, to have results sent to [place]  Reviewed recent vascular surgery notes.  Advised she begin aspirin, statin and get some exercise regularly to help manage the PVD.  Julena was seen today for medication management.  Diagnoses and all orders for this visit:  Renal insufficiency -     Renal Function Panel -     VITAMIN D 25 Hydroxy (Vit-D Deficiency, Fractures) -     CBC with Differential/Platelet -     ANA -     Uric acid -     Protein electrophoresis, serum -      Phosphorus  Hypertension associated with diabetes (HCC) -     Renal Function Panel -     VITAMIN D 25 Hydroxy (Vit-D Deficiency, Fractures) -     CBC with Differential/Platelet -     ANA -     Uric acid -     Protein electrophoresis, serum -     Phosphorus -     amLODipine (NORVASC) 10 MG tablet; Take 1 tablet (10 mg total) by mouth daily.  Hypothyroidism, unspecified type  Type 2 diabetes mellitus with hyperglycemia, without long-term current use of insulin (HCC)  Vitamin D deficiency  High risk medication use -     Renal Function Panel -     CBC with Differential/Platelet -     Uric acid -     Protein electrophoresis, serum -     Phosphorus  PVD (peripheral vascular disease) (HCC)  Smoker  Other orders -     pravastatin (PRAVACHOL) 20 MG tablet; Take 1 tablet (20 mg total) by mouth daily. -     aspirin EC 81 MG tablet; Take 1 tablet (81 mg total) by mouth daily.

## 2017-01-29 ENCOUNTER — Other Ambulatory Visit: Payer: Self-pay | Admitting: Medical

## 2017-01-29 LAB — ANA: ANA: POSITIVE — AB

## 2017-01-29 LAB — ANTI-NUCLEAR AB-TITER (ANA TITER)

## 2017-01-29 LAB — VITAMIN D 25 HYDROXY (VIT D DEFICIENCY, FRACTURES): Vit D, 25-Hydroxy: 41 ng/mL (ref 30–100)

## 2017-01-29 LAB — URIC ACID: Uric Acid, Serum: 9.7 mg/dL — ABNORMAL HIGH (ref 2.5–7.0)

## 2017-01-29 MED ORDER — ALLOPURINOL 100 MG PO TABS: 100.0000 mg | ORAL_TABLET | Freq: Every day | ORAL | 2 refills | Status: AC

## 2017-01-29 MED ORDER — CARVEDILOL 6.25 MG PO TABS: 6.2500 mg | ORAL_TABLET | Freq: Two times a day (BID) | ORAL | 1 refills | Status: AC

## 2017-02-05 LAB — PROTEIN ELECTROPHORESIS, SERUM
ALBUMIN ELP: 4.3 g/dL (ref 3.8–4.8)
ALPHA-2-GLOBULIN: 0.8 g/dL (ref 0.5–0.9)
Alpha-1-Globulin: 0.3 g/dL (ref 0.2–0.3)
Beta 2: 0.4 g/dL (ref 0.2–0.5)
Beta Globulin: 0.4 g/dL (ref 0.4–0.6)
Gamma Globulin: 0.8 g/dL (ref 0.8–1.7)
Total Protein, Serum Electrophoresis: 7 g/dL (ref 6.1–8.1)

## 2017-02-06 ENCOUNTER — Encounter: Payer: Self-pay | Admitting: Internal Medicine

## 2017-02-07 ENCOUNTER — Ambulatory Visit: Payer: BLUE CROSS/BLUE SHIELD | Admitting: Internal Medicine

## 2017-06-26 ENCOUNTER — Encounter (HOSPITAL_COMMUNITY): Payer: BLUE CROSS/BLUE SHIELD

## 2017-06-26 ENCOUNTER — Ambulatory Visit: Payer: BLUE CROSS/BLUE SHIELD | Admitting: Vascular Surgery

## 2017-07-21 ENCOUNTER — Other Ambulatory Visit: Payer: Self-pay | Admitting: Internal Medicine

## 2017-07-22 ENCOUNTER — Other Ambulatory Visit: Payer: Self-pay | Admitting: Medical

## 2017-07-22 DIAGNOSIS — I10 Essential (primary) hypertension: Principal | ICD-10-CM

## 2017-07-22 DIAGNOSIS — E1159 Type 2 diabetes mellitus with other circulatory complications: Secondary | ICD-10-CM

## 2017-07-22 NOTE — Telephone Encounter (Signed)
Called and l/m for to pt call us back to make an appt.

## 2017-07-22 NOTE — Telephone Encounter (Signed)
OK for 3 mo with 0 refills. She needs an appt within the next 3 mo.

## 2017-07-22 NOTE — Telephone Encounter (Signed)
LVM for pt to call back and make appt

## 2017-07-22 NOTE — Telephone Encounter (Signed)
Is this okay to refill?
